# Patient Record
Sex: Female | Born: 1970 | Race: Black or African American | Hispanic: No | Marital: Single | Smoking: Never smoker
Health system: Southern US, Community
[De-identification: ages and names within clinical notes are randomized; demographics above are authoritative.]

## PROBLEM LIST (undated history)

## (undated) DIAGNOSIS — Z789 Other specified health status: Secondary | ICD-10-CM

## (undated) DIAGNOSIS — D219 Benign neoplasm of connective and other soft tissue, unspecified: Secondary | ICD-10-CM

## (undated) HISTORY — PX: FRACTURE SURGERY: SHX138

---

## 1999-07-09 ENCOUNTER — Other Ambulatory Visit: Admission: RE | Admit: 1999-07-09 | Discharge: 1999-07-09 | Payer: Self-pay | Admitting: Obstetrics & Gynecology

## 1999-11-15 ENCOUNTER — Other Ambulatory Visit: Admission: RE | Admit: 1999-11-15 | Discharge: 1999-11-15 | Payer: Self-pay | Admitting: Obstetrics & Gynecology

## 1999-12-25 ENCOUNTER — Other Ambulatory Visit: Admission: RE | Admit: 1999-12-25 | Discharge: 1999-12-25 | Payer: Self-pay | Admitting: *Deleted

## 2010-07-22 ENCOUNTER — Other Ambulatory Visit: Admission: RE | Admit: 2010-07-22 | Discharge: 2010-07-22 | Payer: Self-pay | Admitting: Family Medicine

## 2012-10-27 ENCOUNTER — Emergency Department (HOSPITAL_COMMUNITY): Payer: No Typology Code available for payment source

## 2012-10-27 ENCOUNTER — Emergency Department (HOSPITAL_COMMUNITY)
Admission: EM | Admit: 2012-10-27 | Discharge: 2012-10-27 | Disposition: A | Discharge status: Home / Self Care | Payer: No Typology Code available for payment source | Attending: Emergency Medicine | Admitting: Emergency Medicine

## 2012-10-27 ENCOUNTER — Encounter (HOSPITAL_COMMUNITY): Payer: Self-pay | Admitting: Emergency Medicine

## 2012-10-27 DIAGNOSIS — S4980XA Other specified injuries of shoulder and upper arm, unspecified arm, initial encounter: Secondary | ICD-10-CM | POA: Insufficient documentation

## 2012-10-27 DIAGNOSIS — R42 Dizziness and giddiness: Secondary | ICD-10-CM | POA: Insufficient documentation

## 2012-10-27 DIAGNOSIS — Y9389 Activity, other specified: Secondary | ICD-10-CM | POA: Insufficient documentation

## 2012-10-27 DIAGNOSIS — S46909A Unspecified injury of unspecified muscle, fascia and tendon at shoulder and upper arm level, unspecified arm, initial encounter: Secondary | ICD-10-CM | POA: Insufficient documentation

## 2012-10-27 DIAGNOSIS — Y92410 Unspecified street and highway as the place of occurrence of the external cause: Secondary | ICD-10-CM | POA: Insufficient documentation

## 2012-10-27 DIAGNOSIS — S139XXA Sprain of joints and ligaments of unspecified parts of neck, initial encounter: Secondary | ICD-10-CM

## 2012-10-27 MED ORDER — IBUPROFEN 600 MG PO TABS: 600.0000 mg | ORAL_TABLET | Freq: Four times a day (QID) | ORAL | Status: DC | PRN

## 2012-10-27 MED ORDER — HYDROCODONE-ACETAMINOPHEN 5-500 MG PO TABS: 1.0000 | ORAL_TABLET | Freq: Four times a day (QID) | ORAL | Status: DC | PRN
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
## 2012-10-29 NOTE — ED Provider Notes (Signed)
Medical screening examination/treatment/procedure(s) were performed by non-physician practitioner and as supervising physician I was immediately available for consultation/collaboration.  Kaitlynne Wenz R. Alejandra Hunt, MD 10/29/12 0655 

## 2012-10-29 NOTE — ED Provider Notes (Signed)
Medical screening examination/treatment/procedure(s) were performed by non-physician practitioner and as supervising physician I was immediately available for consultation/collaboration.  Ahnaf Caponi R. Adja Ruff, MD 10/29/12 0655 

## 2015-01-03 ENCOUNTER — Other Ambulatory Visit: Payer: Self-pay | Admitting: Family Medicine

## 2015-01-03 ENCOUNTER — Other Ambulatory Visit (HOSPITAL_COMMUNITY)
Admission: RE | Admit: 2015-01-03 | Discharge: 2015-01-03 | Disposition: A | Discharge status: Home / Self Care | Payer: 59 | Source: Ambulatory Visit | Attending: Family Medicine | Admitting: Family Medicine

## 2015-01-03 DIAGNOSIS — Z01419 Encounter for gynecological examination (general) (routine) without abnormal findings: Secondary | ICD-10-CM | POA: Diagnosis present

## 2015-01-09 LAB — CYTOLOGY - PAP

## 2015-02-22 ENCOUNTER — Other Ambulatory Visit: Payer: Self-pay | Admitting: Obstetrics and Gynecology

## 2015-02-22 DIAGNOSIS — D250 Submucous leiomyoma of uterus: Secondary | ICD-10-CM

## 2015-02-23 ENCOUNTER — Other Ambulatory Visit: Payer: Self-pay | Admitting: Obstetrics and Gynecology

## 2015-02-23 DIAGNOSIS — D250 Submucous leiomyoma of uterus: Secondary | ICD-10-CM

## 2015-05-08 ENCOUNTER — Ambulatory Visit
Admission: RE | Admit: 2015-05-08 | Discharge: 2015-05-08 | Disposition: A | Discharge status: Home / Self Care | Payer: 59 | Source: Ambulatory Visit | Attending: Obstetrics and Gynecology | Admitting: Obstetrics and Gynecology

## 2015-05-08 DIAGNOSIS — D250 Submucous leiomyoma of uterus: Secondary | ICD-10-CM | POA: Insufficient documentation

## 2015-05-08 HISTORY — DX: Benign neoplasm of connective and other soft tissue, unspecified: D21.9

## 2015-05-08 NOTE — Consult Note (Signed)
Chief Complaint:   I am here to discuss fibroid embolization.     Referring Physician(s): Haygood,Vanessa P  History of Present Illness: Barbara Randolph is a 44 y.o. female kindly referred by Dr. Leo Grosser for evaluation for potential uterine artery embolization for symptomatic uterine fibroids.  [name] presents today seeking information about uterine artery embolization as a treatment for her symptomatic uterine fibroids. She reports that her uterine fibroids have recently been diagnosed by ultrasound evaluation which was performed 01/10/2015.   She reports symptoms within all 3 categories of symptoms attributable to uterine fibroids, including heavy bleeding, abdominal pain, and bulk-type symptoms. She reports that her cycles last 5 days to 7 days, and she is required to change her pads 2-3 times per day. She denies any inter-menstrual bleeding with rare spotting. Her cycles have been regular for as long as she can remember. She does report some low abdominal pain and pelvic pressure, with frequent urination and urgency. She also reports having to wake at night to empty her bladder.  Importantly, she has not yet had children, and expresses a wish to become pregnant in the future.  At this point, she has not been treated medically for her symptomatic fibroids. I performed a complete informed consent regarding natural history, pathology/pathophysiology and treatment options for uterine fibroids. Treatment options which I discussed included the surgical option of hysterectomy, medical treatment with hormone therapy, high intensity focused ultrasound, and interventional radiology with bilateral uterine artery embolization. I briefly discussed interventional radiology technique and expectations for the procedure as well as expectations for symptom relief. I quoted [name] the expected success rate of greater than 90% for her symptoms of bulk symptoms, pain relief, and bleeding. This is  consistent with the available literature. I also shared with her a greater than 90% success at 12 months. I shared with her the published information regarding satisfaction of patient's undergoing [place], which is overwhelmingly positive.  Importantly, I had a long discussion regarding her desire for future pregnancy. Specifically, I discussed the guidelines of the Society of Interventional Radiology which caution first-line treatment of symptomatic uterine fibroids with uterine artery embolization in a woman who desires a future pregnancy. I shared with her our current information that can support this as first-line therapy, given that treatment with [place] may preclude successful pregnancy.  I explicitly discussed with her options for treatment, which do include myomectomy as well as high intensity focused ultrasound. I was explicit that HIFU is at least an inconvenient option given the lack of treatment facilities in the region, if not impossible for her.     Past Medical History  Diagnosis Date  . Fibroids     No past surgical history on file.  Allergies: Review of patient's allergies indicates no known allergies.  Medications: Prior to Admission medications   Medication Sig Start Date End Date Taking? Authorizing Provider  cholecalciferol (VITAMIN D) 1000 UNITS tablet Take 1,000 Units by mouth daily.   Yes Historical Provider, MD  ibuprofen (ADVIL,MOTRIN) 600 MG tablet Take 1 tablet (600 mg total) by mouth every 6 (six) hours as needed for pain. 10/27/12  Yes Tatyana Kirichenko, PA-C  Multiple Vitamin (MULTIVITAMIN WITH MINERALS) TABS Take 1 tablet by mouth daily.   Yes Historical Provider, MD  OVER THE COUNTER MEDICATION 4 capsules. Fiber pills from whole foods   Yes Historical Provider, MD  tretinoin microspheres (RETIN-A MICRO) 0.1 % gel Apply 1 application topically at bedtime. On face   Yes Historical Provider, MD  HYDROcodone-acetaminophen (VICODIN) 5-500 MG per tablet Take 1-2 tablets  by mouth every 6 (six) hours as needed for pain. Patient not taking: Reported on 05/08/2015 10/27/12   Jeannett Senior, PA-C     No family history on file.  Social History   Social History  . Marital Status: Single    Spouse Name: N/A  . Number of Children: N/A  . Years of Education: N/A   Social History Main Topics  . Smoking status: Never Smoker   . Smokeless tobacco: Never Used  . Alcohol Use: No  . Drug Use: No  . Sexual Activity: Not on file   Other Topics Concern  . Not on file   Social History Narrative     Review of Systems: A 12 point ROS discussed and pertinent positives are indicated in the HPI above.  All other systems are negative.  Review of Systems  Vital Signs: BP 147/79 mmHg  Pulse 56  Temp(Src) 97.9 F (36.6 C) (Oral)  Resp 14  Ht 5\' 2"  (1.575 m)  Wt 166 lb (75.297 kg)  BMI 30.35 kg/m2  SpO2 100%  LMP 05/01/2015  Physical Exam   Atraumatic normocephalic mucous membranes moist and pink. No icterus or scleral injection. Conjugate gaze. Alert and oriented to person place and time. Normal affect. Good insight. Appropriate questions. Moving all 4 extremities equally. Gross motor and gross sensory intact. Inspiration and expiration symmetric with no labored breathing. Normal excursion of the chest. Abdomen without muscle guarding or rigidity. Genitourinary deferred. No swelling of the lower extremities.  Mallampati Score:  2  Imaging: No results found.  Labs:  CBC: No results for input(s): WBC, HGB, HCT, PLT in the last 8760 hours.  COAGS: No results for input(s): INR, APTT in the last 8760 hours.  BMP: No results for input(s): NA, K, CL, CO2, GLUCOSE, BUN, CALCIUM, CREATININE, GFRNONAA, GFRAA in the last 8760 hours.  Invalid input(s): CMP  LIVER FUNCTION TESTS: No results for input(s): BILITOT, AST, ALT, ALKPHOS, PROT, ALBUMIN in the last 8760 hours.  TUMOR MARKERS: No results for input(s): AFPTM, CEA, CA199, CHROMGRNA in  the last 8760 hours.  Assessment and Plan:  [name] is a 44 year old female with symptomatic uterine fibroids, with symptoms in all 3 categories of a triple symptoms.  She is a woman who desires a future pregnancy, and I had a long discussion with her regarding uterine fibroid embolization as first-line therapy given her pregnancy desire (see above discussion in the history of present illness).  At this time, given her clear hesitancy to proceed with uterine artery embolization with the possibility of difficulty with a future pregnancy, she will pursue other information regarding other options. I specifically gave her information about HIFU and myomectomy, which she seems motivated to investigate.  She understands that she may call us at any time if she wishes to proceed, as she is a likely candidate for [place]. If she wishes to proceed, I would order MRI of the pelvis for evaluation.  I answered all of her questions to the best of my ability. She seems satisfied with our discussion, and agrees with the plan of care.  Thank you for this interesting consult.  I greatly enjoyed meeting [facility] and look forward to participating in their care.  A copy of this report was sent to the requesting provider on this date.  SignedCorrie Mckusick 05/08/2015, 5:00 PM   I spent a total of  40 Minutes   in face to face in  clinical consultation, greater than 50% of which was counseling/coordinating care for symptomatic uterine fibroids, and the possibility of uterine artery embolization.

## 2016-01-28 ENCOUNTER — Other Ambulatory Visit: Payer: Self-pay | Admitting: Obstetrics and Gynecology

## 2016-01-30 ENCOUNTER — Encounter (HOSPITAL_COMMUNITY): Payer: Self-pay

## 2016-01-30 ENCOUNTER — Encounter (HOSPITAL_COMMUNITY)
Admission: RE | Admit: 2016-01-30 | Discharge: 2016-01-30 | Disposition: A | Discharge status: Home / Self Care | Payer: 59 | Source: Ambulatory Visit | Attending: Obstetrics and Gynecology | Admitting: Obstetrics and Gynecology

## 2016-01-30 DIAGNOSIS — Z01812 Encounter for preprocedural laboratory examination: Secondary | ICD-10-CM | POA: Diagnosis not present

## 2016-01-30 DIAGNOSIS — D259 Leiomyoma of uterus, unspecified: Secondary | ICD-10-CM | POA: Insufficient documentation

## 2016-01-30 DIAGNOSIS — D649 Anemia, unspecified: Secondary | ICD-10-CM | POA: Diagnosis not present

## 2016-01-30 HISTORY — DX: Other specified health status: Z78.9

## 2016-01-30 LAB — ABO/RH: ABO/RH(D): A POS

## 2016-01-30 LAB — CBC
HEMATOCRIT: 35.5 % — AB (ref 36.0–46.0)
HEMOGLOBIN: 10.7 g/dL — AB (ref 12.0–15.0)
MCH: 23.4 pg — ABNORMAL LOW (ref 26.0–34.0)
MCHC: 30.1 g/dL (ref 30.0–36.0)
MCV: 77.7 fL — AB (ref 78.0–100.0)
Platelets: 330 10*3/uL (ref 150–400)
RBC: 4.57 MIL/uL (ref 3.87–5.11)
RDW: 16.5 % — ABNORMAL HIGH (ref 11.5–15.5)
WBC: 5.1 10*3/uL (ref 4.0–10.5)

## 2016-01-30 LAB — TYPE AND SCREEN
ABO/RH(D): A POS
ANTIBODY SCREEN: NEGATIVE

## 2016-01-30 NOTE — Patient Instructions (Signed)
Your procedure is scheduled on:02/04/15  Enter through the Main Entrance at :6am Pick up desk phone and dial [phone] and inform us of your arrival.  Please call [phone] if you have any problems the morning of surgery.  Remember: Do not eat food or drink liquids, including water, after midnight:Sunday    You may brush your teeth the morning of surgery.  Take these meds the morning of surgery with a sip of water:none  DO NOT wear jewelry, eye make-up, lipstick,body lotion, or dark fingernail polish.  (Polished toes are ok) You may wear deodorant.  If you are to be admitted after surgery, leave suitcase in car until your room has been assigned.  Wear loose fitting, comfortable clothes for your ride home.

## 2016-02-01 ENCOUNTER — Other Ambulatory Visit (HOSPITAL_COMMUNITY): Payer: Self-pay | Admitting: Obstetrics and Gynecology

## 2016-02-01 NOTE — H&P (Signed)
Barbara Randolph is a 45 y.o. female  G:0 presents for an abdominal myomectomy because of large  symptomatic uterine fibroids.  The patient was referred by her primary care physician, Dr. Donald Prose for evaluation and management of her fibroids.  She was diagnosed in 2016 with fibroids and since that time has noticed increasing urinary frequency, nocturia and constipation.  She denies any pelvic pain, urinary retention, post void dribbling,  or diarrhea.  Her menstrual flow lasts for 5 days and she changes her protection 3 times a day.  Though she experiences menstrual cramping rated at 5/10 on a 10 point pain scale she gets complete relief with Motrin 400 mg.   After a review of both medical and surgical management options for fibroids the patient has decided to undergo myomectomy for management.   Past Medical History  OB History: G: 0  GYN History: menarche: 45 YO    LMP: 01/23/16    Contracepton abstinence  The patient denies history of sexually transmitted disease.  Has a remote history of abnormal PAP smear  but after if was repeated it returned normal.  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 ferrous sulfate 325 (65 FE) MG tablet Take 325 mg by mouth 2 (two) times daily with a meal.  . Multiple Vitamin (MULTIVITAMIN WITH MINERALS) TABS Take 1 tablet by mouth daily.  Marland Kitchen OVER THE COUNTER MEDICATION Take 4 capsules by mouth daily. Fiber pills from whole foods  . tretinoin microspheres (RETIN-A MICRO) 0.1 % gel Apply 1 application topically at bedtime. On face   No facility-administered encounter medications on file as of 02/01/2016.   Tazorac  0.05% Topical Gel  as directed Clindamycin 1%/Benzoyl Peroxide 5% Topical Gel as directed   No Known Allergies    Denies sensitivity to peanuts, shellfish, soy, latex or adhesives.   ROS: Admits to glasses,  acne,  constipation, urinary frequency ,  nocturia and left ankle pain;   Denies headache, vision changes, nasal congestion, dysphagia, tinnitus, dizziness, hoarseness, cough,  chest pain, shortness of breath, nausea, vomiting, diarrhea,  urgency  dysuria, hematuria, vaginitis symptoms, pelvic pain, swelling of joints,easy bruising,  myalgias,  skin rashes, unexplained weight loss and except as is mentioned in the history of present illness, patient's review of systems is otherwise negative.   Physical Exam  Bp: 122/68   P: 68   R: 18  Temperature: 98.8 degrees F orally   Weight: 192 lbs.  Height: 5' 2.5 "  BMI: 34.6  Neck: supple without masses or thyromegaly Lungs: clear to auscultation Heart: regular rate and rhythm Abdomen: firm mass from pelvis to approximately  fingers below the umbilicus though exam limited by habitus and no organomegaly Pelvic:EGBUS- wnl; vagina-normal rugae; uterus-20 weeks  size cervix without lesions or motion tenderness; adnexae-no tenderness or masses Extremities:  no clubbing, cyanosis or edema   Assesment: Symptomatic Uterine Fibroids   Disposition:  The patient was given the indication for her procedure along with its risks to include, but not limited to:  reaction to anesthesia, damage to adjacent organs, infection, excessive bleeding and if the uterine cavity is breached, a C-section delivery would be  necessary. The patient verbalized understanding of these risks and has consented to proceed with an Abdominal Myomectomy at [facility] on Feb 04, 2016.   CSN# [id number]   Whittaker Lenis J. Florene Glen, PA-C  for Dr. Seymour Bars. Haygood

## 2016-02-03 MED ORDER — DEXTROSE 5 % IV SOLN
2.0000 g | INTRAVENOUS | Status: AC
  Administered 2016-02-04: 2 g via INTRAVENOUS
  Filled 2016-02-03: qty 2

## 2016-02-04 ENCOUNTER — Inpatient Hospital Stay (HOSPITAL_COMMUNITY)
Admission: RE | Admit: 2016-02-04 | Discharge: 2016-02-05 | DRG: 743 | Disposition: A | Discharge status: Home / Self Care | Payer: 59 | Source: Ambulatory Visit | Attending: Obstetrics and Gynecology | Admitting: Obstetrics and Gynecology

## 2016-02-04 ENCOUNTER — Inpatient Hospital Stay (HOSPITAL_COMMUNITY): Payer: 59 | Admitting: Certified Registered Nurse Anesthetist

## 2016-02-04 ENCOUNTER — Encounter (HOSPITAL_COMMUNITY): Payer: Self-pay

## 2016-02-04 ENCOUNTER — Encounter (HOSPITAL_COMMUNITY)
Admission: RE | Disposition: A | Discharge status: Home / Self Care | Payer: Self-pay | Source: Ambulatory Visit | Attending: Obstetrics and Gynecology

## 2016-02-04 DIAGNOSIS — D500 Iron deficiency anemia secondary to blood loss (chronic): Secondary | ICD-10-CM | POA: Diagnosis present

## 2016-02-04 DIAGNOSIS — D259 Leiomyoma of uterus, unspecified: Secondary | ICD-10-CM | POA: Diagnosis present

## 2016-02-04 DIAGNOSIS — D250 Submucous leiomyoma of uterus: Secondary | ICD-10-CM | POA: Diagnosis present

## 2016-02-04 HISTORY — PX: MYOMECTOMY: SHX85

## 2016-02-04 LAB — PREGNANCY, URINE: PREG TEST UR: NEGATIVE

## 2016-02-04 SURGERY — MYOMECTOMY, ABDOMINAL APPROACH
Anesthesia: Epidural | Site: Abdomen

## 2016-02-04 MED ORDER — VASOPRESSIN 20 UNIT/ML IJ SOLN
INTRAMUSCULAR | Status: DC | PRN
  Administered 2016-02-04: 40 [IU] via INTRAMUSCULAR

## 2016-02-04 MED ORDER — NEOSTIGMINE METHYLSULFATE 10 MG/10ML IV SOLN
INTRAVENOUS | Status: AC
  Filled 2016-02-04: qty 1

## 2016-02-04 MED ORDER — PROPOFOL 10 MG/ML IV BOLUS
INTRAVENOUS | Status: AC
  Filled 2016-02-04: qty 20

## 2016-02-04 MED ORDER — KETOROLAC TROMETHAMINE 30 MG/ML IJ SOLN: 30.0000 mg | Freq: Once | INTRAMUSCULAR | Status: DC

## 2016-02-04 MED ORDER — MIDAZOLAM HCL 2 MG/2ML IJ SOLN
INTRAMUSCULAR | Status: AC
  Filled 2016-02-04: qty 2

## 2016-02-04 MED ORDER — IBUPROFEN 600 MG PO TABS
600.0000 mg | ORAL_TABLET | Freq: Four times a day (QID) | ORAL | Status: DC
  Administered 2016-02-05 (×2): 600 mg via ORAL
  Filled 2016-02-04 (×2): qty 1

## 2016-02-04 MED ORDER — MEPERIDINE HCL 25 MG/ML IJ SOLN: 6.2500 mg | INTRAMUSCULAR | Status: DC | PRN

## 2016-02-04 MED ORDER — BUPIVACAINE HCL (PF) 0.25 % IJ SOLN
INTRAMUSCULAR | Status: DC | PRN
  Administered 2016-02-04: 20 mL

## 2016-02-04 MED ORDER — ACETAMINOPHEN 10 MG/ML IV SOLN
1000.0000 mg | Freq: Once | INTRAVENOUS | Status: AC
  Administered 2016-02-04: 1000 mg via INTRAVENOUS
  Filled 2016-02-04: qty 100

## 2016-02-04 MED ORDER — SODIUM CHLORIDE 0.9 % IJ SOLN
INTRAMUSCULAR | Status: DC | PRN
  Administered 2016-02-04: 100 mL

## 2016-02-04 MED ORDER — ONDANSETRON HCL 4 MG/2ML IJ SOLN
4.0000 mg | Freq: Once | INTRAMUSCULAR | Status: DC | PRN
Start: 2016-02-04 — End: 2016-02-04

## 2016-02-04 MED ORDER — SCOPOLAMINE 1 MG/3DAYS TD PT72
MEDICATED_PATCH | TRANSDERMAL | Status: AC
  Administered 2016-02-04: 1.5 mg via TRANSDERMAL
  Filled 2016-02-04: qty 1

## 2016-02-04 MED ORDER — PROPOFOL 10 MG/ML IV BOLUS
INTRAVENOUS | Status: DC | PRN
  Administered 2016-02-04: 50 mg via INTRAVENOUS
  Administered 2016-02-04: 200 mg via INTRAVENOUS
  Administered 2016-02-04: 30 mg via INTRAVENOUS

## 2016-02-04 MED ORDER — VASOPRESSIN 20 UNIT/ML IV SOLN
INTRAVENOUS | Status: AC
  Filled 2016-02-04: qty 2

## 2016-02-04 MED ORDER — LACTATED RINGERS IV SOLN
INTRAVENOUS | Status: DC
  Administered 2016-02-04: 14:00:00 via INTRAVENOUS

## 2016-02-04 MED ORDER — ROCURONIUM BROMIDE 100 MG/10ML IV SOLN
INTRAVENOUS | Status: DC | PRN
  Administered 2016-02-04: 5 mg via INTRAVENOUS
  Administered 2016-02-04: 10 mg via INTRAVENOUS
  Administered 2016-02-04: 45 mg via INTRAVENOUS
  Administered 2016-02-04: 20 mg via INTRAVENOUS
  Administered 2016-02-04: 10 mg via INTRAVENOUS

## 2016-02-04 MED ORDER — MIDAZOLAM HCL 2 MG/2ML IJ SOLN
INTRAMUSCULAR | Status: DC | PRN
  Administered 2016-02-04: 2 mg via INTRAVENOUS

## 2016-02-04 MED ORDER — HYDROMORPHONE HCL 1 MG/ML IJ SOLN
INTRAMUSCULAR | Status: DC | PRN
  Administered 2016-02-04 (×2): 1 mg via INTRAVENOUS

## 2016-02-04 MED ORDER — NEOSTIGMINE METHYLSULFATE 10 MG/10ML IV SOLN
INTRAVENOUS | Status: DC | PRN
  Administered 2016-02-04: 3 mg via INTRAVENOUS

## 2016-02-04 MED ORDER — PHENYLEPHRINE 40 MCG/ML (10ML) SYRINGE FOR IV PUSH (FOR BLOOD PRESSURE SUPPORT)
PREFILLED_SYRINGE | INTRAVENOUS | Status: AC
  Filled 2016-02-04: qty 10

## 2016-02-04 MED ORDER — FENTANYL CITRATE (PF) 100 MCG/2ML IJ SOLN
INTRAMUSCULAR | Status: DC | PRN
  Administered 2016-02-04 (×2): 50 ug via INTRAVENOUS
  Administered 2016-02-04: 100 ug via INTRAVENOUS
  Administered 2016-02-04 (×4): 50 ug via INTRAVENOUS

## 2016-02-04 MED ORDER — LIDOCAINE HCL (CARDIAC) 20 MG/ML IV SOLN
INTRAVENOUS | Status: DC | PRN
  Administered 2016-02-04: 80 mg via INTRAVENOUS

## 2016-02-04 MED ORDER — KETOROLAC TROMETHAMINE 30 MG/ML IJ SOLN
INTRAMUSCULAR | Status: AC
  Filled 2016-02-04: qty 1

## 2016-02-04 MED ORDER — SCOPOLAMINE 1 MG/3DAYS TD PT72
1.0000 | MEDICATED_PATCH | Freq: Once | TRANSDERMAL | Status: DC
  Administered 2016-02-04: 1.5 mg via TRANSDERMAL

## 2016-02-04 MED ORDER — BUPIVACAINE HCL (PF) 0.25 % IJ SOLN
INTRAMUSCULAR | Status: AC
  Filled 2016-02-04: qty 30

## 2016-02-04 MED ORDER — HEPARIN SODIUM (PORCINE) 5000 UNIT/ML IJ SOLN
INTRAMUSCULAR | Status: AC
  Filled 2016-02-04: qty 1

## 2016-02-04 MED ORDER — KETOROLAC TROMETHAMINE 30 MG/ML IJ SOLN
INTRAMUSCULAR | Status: DC | PRN
  Administered 2016-02-04: 30 mg via INTRAVENOUS

## 2016-02-04 MED ORDER — DEXAMETHASONE SODIUM PHOSPHATE 4 MG/ML IJ SOLN
INTRAMUSCULAR | Status: AC
  Filled 2016-02-04: qty 1

## 2016-02-04 MED ORDER — GLYCOPYRROLATE 0.2 MG/ML IJ SOLN
INTRAMUSCULAR | Status: DC | PRN
Start: 2016-02-04 — End: 2016-02-04
  Administered 2016-02-04: .4 mg via INTRAVENOUS

## 2016-02-04 MED ORDER — ONDANSETRON HCL 4 MG/2ML IJ SOLN
4.0000 mg | Freq: Four times a day (QID) | INTRAMUSCULAR | Status: DC | PRN
  Administered 2016-02-04: 4 mg via INTRAVENOUS
  Filled 2016-02-04: qty 2

## 2016-02-04 MED ORDER — ONDANSETRON HCL 4 MG/2ML IJ SOLN
INTRAMUSCULAR | Status: AC
  Filled 2016-02-04: qty 2

## 2016-02-04 MED ORDER — DEXAMETHASONE SODIUM PHOSPHATE 4 MG/ML IJ SOLN
INTRAMUSCULAR | Status: DC | PRN
  Administered 2016-02-04: 4 mg via INTRAVENOUS

## 2016-02-04 MED ORDER — FENTANYL CITRATE (PF) 250 MCG/5ML IJ SOLN
INTRAMUSCULAR | Status: AC
  Filled 2016-02-04: qty 5

## 2016-02-04 MED ORDER — SENNOSIDES-DOCUSATE SODIUM 8.6-50 MG PO TABS: 1.0000 | ORAL_TABLET | Freq: Every evening | ORAL | Status: DC | PRN

## 2016-02-04 MED ORDER — PROPOFOL 10 MG/ML IV BOLUS
INTRAVENOUS | Status: AC
  Filled 2016-02-04: qty 40

## 2016-02-04 MED ORDER — PANTOPRAZOLE SODIUM 40 MG PO TBEC
40.0000 mg | DELAYED_RELEASE_TABLET | Freq: Every day | ORAL | Status: DC
  Administered 2016-02-05: 40 mg via ORAL
  Filled 2016-02-04: qty 1

## 2016-02-04 MED ORDER — HEPARIN SODIUM (PORCINE) 5000 UNIT/ML IJ SOLN
INTRAMUSCULAR | Status: DC | PRN
  Administered 2016-02-04: 5000 [IU]

## 2016-02-04 MED ORDER — ALUM & MAG HYDROXIDE-SIMETH 200-200-20 MG/5ML PO SUSP: 30.0000 mL | ORAL | Status: DC | PRN

## 2016-02-04 MED ORDER — SIMETHICONE 80 MG PO CHEW: 80.0000 mg | CHEWABLE_TABLET | Freq: Four times a day (QID) | ORAL | Status: DC | PRN

## 2016-02-04 MED ORDER — KETOROLAC TROMETHAMINE 30 MG/ML IJ SOLN: 30.0000 mg | Freq: Four times a day (QID) | INTRAMUSCULAR | Status: AC

## 2016-02-04 MED ORDER — ONDANSETRON HCL 4 MG PO TABS: 4.0000 mg | ORAL_TABLET | Freq: Four times a day (QID) | ORAL | Status: DC | PRN

## 2016-02-04 MED ORDER — ONDANSETRON HCL 4 MG/2ML IJ SOLN
INTRAMUSCULAR | Status: DC | PRN
  Administered 2016-02-04: 4 mg via INTRAVENOUS

## 2016-02-04 MED ORDER — HYDROMORPHONE HCL 1 MG/ML IJ SOLN
0.2000 mg | INTRAMUSCULAR | Status: DC | PRN
  Administered 2016-02-04: 0.6 mg via INTRAVENOUS
  Filled 2016-02-04: qty 1

## 2016-02-04 MED ORDER — LIDOCAINE HCL (CARDIAC) 20 MG/ML IV SOLN
INTRAVENOUS | Status: AC
  Filled 2016-02-04: qty 5

## 2016-02-04 MED ORDER — SUCCINYLCHOLINE CHLORIDE 20 MG/ML IJ SOLN
INTRAMUSCULAR | Status: AC
  Filled 2016-02-04: qty 1

## 2016-02-04 MED ORDER — HYDROMORPHONE HCL 1 MG/ML IJ SOLN
INTRAMUSCULAR | Status: AC
  Filled 2016-02-04: qty 1

## 2016-02-04 MED ORDER — SUCCINYLCHOLINE CHLORIDE 20 MG/ML IJ SOLN
INTRAMUSCULAR | Status: DC | PRN
  Administered 2016-02-04: 120 mg via INTRAVENOUS

## 2016-02-04 MED ORDER — HYDROMORPHONE HCL 1 MG/ML IJ SOLN: 0.2500 mg | INTRAMUSCULAR | Status: DC | PRN

## 2016-02-04 MED ORDER — SODIUM CHLORIDE 0.9 % IJ SOLN
INTRAMUSCULAR | Status: AC
  Filled 2016-02-04: qty 100

## 2016-02-04 MED ORDER — GLYCOPYRROLATE 0.2 MG/ML IJ SOLN
INTRAMUSCULAR | Status: AC
  Filled 2016-02-04: qty 2

## 2016-02-04 MED ORDER — LACTATED RINGERS IV SOLN
INTRAVENOUS | Status: DC
  Administered 2016-02-04 (×3): via INTRAVENOUS

## 2016-02-04 MED ORDER — KETOROLAC TROMETHAMINE 30 MG/ML IJ SOLN
30.0000 mg | Freq: Four times a day (QID) | INTRAMUSCULAR | Status: AC
  Administered 2016-02-04 (×2): 30 mg via INTRAVENOUS
  Filled 2016-02-04 (×3): qty 1

## 2016-02-04 MED ORDER — HYDROCODONE-ACETAMINOPHEN 5-325 MG PO TABS
1.0000 | ORAL_TABLET | ORAL | Status: DC | PRN
  Administered 2016-02-05 (×2): 1 via ORAL
  Filled 2016-02-04 (×2): qty 1

## 2016-02-04 MED ORDER — ROCURONIUM BROMIDE 100 MG/10ML IV SOLN
INTRAVENOUS | Status: AC
  Filled 2016-02-04: qty 1

## 2016-02-04 SURGICAL SUPPLY — 49 items
BARRIER ADHS 3X4 INTERCEED (GAUZE/BANDAGES/DRESSINGS) ×4 IMPLANT
BLADE EXTENDED COATED 6.5IN (ELECTRODE) IMPLANT
BRR ADH 4X3 ABS CNTRL BYND (GAUZE/BANDAGES/DRESSINGS) ×2
CANISTER SUCT 3000ML (MISCELLANEOUS) ×3 IMPLANT
CLOTH BEACON ORANGE TIMEOUT ST (SAFETY) ×3 IMPLANT
CONT PATH 16OZ SNAP LID 3702 (MISCELLANEOUS) ×1 IMPLANT
DECANTER SPIKE VIAL GLASS SM (MISCELLANEOUS) ×5 IMPLANT
DRAPE CESAREAN BIRTH W POUCH (DRAPES) ×3 IMPLANT
DRAPE SURG 17X11 SM STRL (DRAPES) ×6 IMPLANT
DRSG OPSITE POSTOP 4X10 (GAUZE/BANDAGES/DRESSINGS) ×2 IMPLANT
DURAPREP 26ML APPLICATOR (WOUND CARE) ×3 IMPLANT
ELECT CAUTERY BLADE 6.4 (BLADE) ×2 IMPLANT
ELECT NDL TIP 2.8 STRL (NEEDLE) IMPLANT
ELECT NEEDLE TIP 2.8 STRL (NEEDLE) IMPLANT
GAUZE SPONGE 4X4 16PLY XRAY LF (GAUZE/BANDAGES/DRESSINGS) ×4 IMPLANT
GLOVE BIOGEL PI IND STRL 7.0 (GLOVE) ×1 IMPLANT
GLOVE BIOGEL PI INDICATOR 7.0 (GLOVE) ×2
GLOVE SURG SS PI 6.5 STRL IVOR (GLOVE) ×6 IMPLANT
GOWN STRL REUS W/TWL LRG LVL3 (GOWN DISPOSABLE) ×9 IMPLANT
LIQUID BAND (GAUZE/BANDAGES/DRESSINGS) ×2 IMPLANT
NDL SPNL 22GX3.5 QUINCKE BK (NEEDLE) ×1 IMPLANT
NEEDLE HYPO 22GX1.5 SAFETY (NEEDLE) ×5 IMPLANT
NEEDLE SPNL 22GX3.5 QUINCKE BK (NEEDLE) ×3 IMPLANT
NS IRRIG 1000ML POUR BTL (IV SOLUTION) ×4 IMPLANT
PACK ABDOMINAL GYN (CUSTOM PROCEDURE TRAY) ×3 IMPLANT
PAD OB MATERNITY 4.3X12.25 (PERSONAL CARE ITEMS) ×3 IMPLANT
PENCIL SMOKE EVAC W/HOLSTER (ELECTROSURGICAL) ×3 IMPLANT
RINGERS IRRIG 1000ML POUR BTL (IV SOLUTION) ×5 IMPLANT
SPONGE LAP 18X18 X RAY DECT (DISPOSABLE) ×6 IMPLANT
STAPLER VISISTAT 35W (STAPLE) IMPLANT
SUT MNCRL AB 3-0 PS2 27 (SUTURE) IMPLANT
SUT PDS AB 0 CT 36 (SUTURE) IMPLANT
SUT VIC AB 0 CT1 18XCR BRD8 (SUTURE) IMPLANT
SUT VIC AB 0 CT1 27 (SUTURE) ×6
SUT VIC AB 0 CT1 27XBRD ANBCTR (SUTURE) ×2 IMPLANT
SUT VIC AB 0 CT1 8-18 (SUTURE) ×9
SUT VIC AB 2-0 CT1 27 (SUTURE) ×3
SUT VIC AB 2-0 CT1 TAPERPNT 27 (SUTURE) ×1 IMPLANT
SUT VIC AB 3-0 CT1 27 (SUTURE)
SUT VIC AB 3-0 CT1 TAPERPNT 27 (SUTURE) IMPLANT
SUT VIC AB 3-0 SH 27 (SUTURE) ×18
SUT VIC AB 3-0 SH 27X BRD (SUTURE) ×2 IMPLANT
SUT VICRYL 0 TIES 12 18 (SUTURE) IMPLANT
SUT VICRYL 2 0 18  UND BR (SUTURE)
SUT VICRYL 2 0 18 UND BR (SUTURE) IMPLANT
SYR CONTROL 10ML LL (SYRINGE) ×6 IMPLANT
TOWEL OR 17X24 6PK STRL BLUE (TOWEL DISPOSABLE) ×6 IMPLANT
TRAY FOLEY CATH SILVER 14FR (SET/KITS/TRAYS/PACK) ×3 IMPLANT
WATER STERILE IRR 1000ML POUR (IV SOLUTION) ×1 IMPLANT

## 2016-02-04 NOTE — H&P (Signed)
History and Physical Interval Note:   02/04/2016   7:15 AM   Barbara Randolph  has presented today for surgery, with the diagnosis of Uterine Fibroids  The various methods of treatment have been discussed with the patient and family. After consideration of risks, benefits and other options for treatment, the patient has consented to  Procedure(s): MYOMECTOMY as a surgical intervention .  I have reviewed the patients' chart and labs.  Questions were answered to the patient's satisfaction.     Eldred Manges  MD

## 2016-02-04 NOTE — Anesthesia Preprocedure Evaluation (Addendum)
Anesthesia Evaluation  Patient identified by MRN, date of birth, ID band Patient awake    Reviewed: Allergy & Precautions, H&P , NPO status , Patient's Chart, lab work & pertinent test results  Airway Mallampati: IV  TM Distance: >3 FB Neck ROM: full  Mouth opening: Limited Mouth Opening  Dental no notable dental hx.    Pulmonary neg pulmonary ROS,    Pulmonary exam normal        Cardiovascular negative cardio ROS Normal cardiovascular exam     Neuro/Psych negative neurological ROS  negative psych ROS   GI/Hepatic negative GI ROS, Neg liver ROS,   Endo/Other  negative endocrine ROS  Renal/GU negative Renal ROS     Musculoskeletal   Abdominal (+) + obese,   Peds  Hematology negative hematology ROS (+)   Anesthesia Other Findings   Reproductive/Obstetrics (+) Pregnancy                            Anesthesia Physical Anesthesia Plan  ASA: II  Anesthesia Plan: Epidural and General   Post-op Pain Management:    Induction: Intravenous  Airway Management Planned: Oral ETT  Additional Equipment:   Intra-op Plan:   Post-operative Plan: Extubation in OR  Informed Consent: I have reviewed the patients History and Physical, chart, labs and discussed the procedure including the risks, benefits and alternatives for the proposed anesthesia with the patient or authorized representative who has indicated his/her understanding and acceptance.   Dental advisory given  Plan Discussed with: CRNA and Surgeon  Anesthesia Plan Comments:        Anesthesia Quick Evaluation

## 2016-02-04 NOTE — Anesthesia Postprocedure Evaluation (Signed)
Anesthesia Post Note  Patient: [name]  Procedure(s) Performed: Procedure(s) (LRB): MYOMECTOMY (N/A)  Patient location during evaluation: [facility] Anesthesia Type: General Level of consciousness: awake, awake and alert and oriented Pain management: pain level controlled Vital Signs Assessment: post-procedure vital signs reviewed and stable Respiratory status: spontaneous breathing, nonlabored ventilation, respiratory function stable and patient connected to nasal cannula oxygen Cardiovascular status: stable Postop Assessment: no signs of nausea or vomiting and adequate PO intake Anesthetic complications: no     Last Vitals:  Filed Vitals:   02/04/16 1348 02/04/16 1647  BP: 115/67 113/65  Pulse: 64 59  Temp: 36.5 C 36.6 C  Resp: 16 16    Last Pain:  Filed Vitals:   02/04/16 1648  PainSc: Asleep   Pain Goal: Patients Stated Pain Goal: 4 (02/04/16 1238)               Willa Rough

## 2016-02-04 NOTE — Anesthesia Procedure Notes (Signed)
Procedure Name: Intubation Date/Time: 02/04/2016 7:30 AM Performed by: Elenore Paddy Pre-anesthesia Checklist: Patient identified, Emergency Drugs available, Suction available, Patient being monitored and Timeout performed Patient Re-evaluated:Patient Re-evaluated prior to inductionOxygen Delivery Method: Circle system utilized Preoxygenation: Pre-oxygenation with 100% oxygen Intubation Type: IV induction Ventilation: Mask ventilation without difficulty Laryngoscope Size: Glidescope and 3 (Glidescope used electively because of MP 4 airway exam.) Grade View: Grade I Tube type: Oral Tube size: 7.0 mm Number of attempts: 1

## 2016-02-04 NOTE — Transfer of Care (Signed)
Immediate Anesthesia Transfer of Care Note  Patient: Barbara Randolph  Procedure(s) Performed: Procedure(s): MYOMECTOMY (N/A)  Patient Location: PACU  Anesthesia Type:General  Level of Consciousness: awake, alert  and oriented  Airway & Oxygen Therapy: Patient Spontanous Breathing and Patient connected to nasal cannula oxygen  Post-op Assessment: Report given to RN and Post -op Vital signs reviewed and stable  Post vital signs: Reviewed and stable  Last Vitals:  Filed Vitals:   02/04/16 0611  BP: 135/85  Pulse: 66  Temp: 36.7 C  Resp: 16    Last Pain: There were no vitals filed for this visit.       Complications: No apparent anesthesia complications

## 2016-02-04 NOTE — Op Note (Signed)
Procedure(s): MYOMECTOMY Procedure Note  Barbara Randolph female 45 y.o. 02/04/2016  Procedure(s) and Anesthesia Type:    * MYOMECTOMY - General  Surgeon(s) and Role:    * Eldred Manges, MD - Primary    * Ena Dawley, MD - Assisting   Indications: The patient was admitted for management of symptomatic uterine fibroids     Surgeon: Kendall Flack P   Assistants: Gildardo Cranker M.D.  Anesthesia: General endotracheal anesthesia  ASA Class: 2    Procedure Detail  MYOMECTOMY  Findings:  The uterus was enlarged to approximately 24 weeks size with multiple submucosal myomata. These were situated so serosa only in the posterior fundus, the right and left cornual regions, and the anterior fundus. The tubes and ovaries were normal bilaterally.  Estimated Blood Loss:  500 cc  Blood Given: none          Specimens: Fibroids weighing 1151 g in aggregate. They ranged in size from 2 fibroids which were less than 2 cm to 4 fibroids which were 3 x 5 cm, 7 x 7 cm, 8 x 6 cm, and 15 x 10 cm, respectively.               Complications:  None         Disposition: PACU - hemodynamically stable.         Condition: stable  Procedure: The patient was taken to the operating room after appropriate identification placed on the operating table. After the attainment of adequate general anesthesia with the patient in the supine position. The perineum and vagina were prepped with multiple layers of Betadine. A Foley catheter was inserted into the bladder and connected to straight drainage.   A timeout was performed and the abdomen prepped with ChloraPrep and allowed to dry. The abdomen was then draped as a sterile field. Suprapubic injection of quarter percent Marcaine for a total of 20 cc was undertaken.   A suprapubic transverse incision was then made and the abdomen opened in layers. Peritoneum was entered and examination of the pelvis and abdomen undertaken. The enlarged uterus.  Fibroids were then delivered through the suprapubic vision onto the operative field. The uterus was surrounded with moist laparotomy pads a dilute solution of Pitressin was injected subserosally over the anterior fundal fibroid and that serosa incised down to the level of the fibroid. A combination of blunt and sharp dissection were undertaken to remove the fibroid from the myoma bed. The myoma bed was then closed with interrupted sutures of 0 Vicryl. A similar procedure was carried out with the remaining uterine fibroids, leaving the largest of them which was on the posterior fundus for last. Once hemostasis had been secured with the 0 Vicryl sutures in the myoma bed. The serosa over the 4 incisions in the uterus was reapproximated in an umbilicated fashion with a running suture of 3-0 Vicryl. Hemostasis was noted to be adequate. Copious irrigation was carried out.   Interceed was placed over each of the 4 uterine incisions and the uterus placed back into the abdominal cavity. Approximately 60 cc of warm lactated Ringer's was left in the peritoneal cavity. The abdominal peritoneum was closed with a running suture of 2-0 Vicryl. The rectus fascia was closed with running suture of 0 Vicryl from each apex to the midline and tied in the midline. The subcutaneous tissue was made hemostatic with Bovie cautery and irrigated. The skin incision was closed with a subcuticular suture of 3-0 Monocryl. Dermabond was applied to the  incision and a sterile dressing applied.   The patient was awakened from general anesthesia and taken to the recovery room in satisfactory condition having tolerated the procedure well with sponge and instrument counts correct.   Sequential compression devices were in place during the entire procedure.

## 2016-02-04 NOTE — Progress Notes (Signed)
Day of Surgery Procedure(s) (LRB): MYOMECTOMY (N/A)  Subjective: Patient reports no nausea or vomiting.   incisional pain controlled with single dose of IV dilaudid.  Tolerating PO.    Objective: I have reviewed patient's vital signs, intake and output and medications.  General: alert and cooperative Resp: clear to auscultation bilaterally Cardio: regular rate and rhythm, S1, S2 normal, no murmur, click, rub or gallop GI: soft, non-tender; bowel sounds normal; no masses,  no organomegaly and dressing: clean and dry Extremities: extremities normal, atraumatic, no cyanosis or edema Vaginal Bleeding: none  Assessment: s/p Procedure(s): MYOMECTOMY (N/A): stable  Plan: Advance diet Encourage ambulation Advance to PO medication Discontinue IV fluids in am if tolerating po's Discharge home 5/23 or 5/24 once benchmarks met.  LOS: 0 days    Jarry Manon P 02/04/2016, 6:49 PM

## 2016-02-05 ENCOUNTER — Encounter (HOSPITAL_COMMUNITY): Payer: Self-pay | Admitting: Obstetrics and Gynecology

## 2016-02-05 LAB — CBC
HEMATOCRIT: 31.1 % — AB (ref 36.0–46.0)
HEMOGLOBIN: 9.5 g/dL — AB (ref 12.0–15.0)
MCH: 24.1 pg — ABNORMAL LOW (ref 26.0–34.0)
MCHC: 30.5 g/dL (ref 30.0–36.0)
MCV: 78.7 fL (ref 78.0–100.0)
Platelets: 228 10*3/uL (ref 150–400)
RBC: 3.95 MIL/uL (ref 3.87–5.11)
RDW: 17.8 % — AB (ref 11.5–15.5)
WBC: 9.6 10*3/uL (ref 4.0–10.5)

## 2016-02-05 MED ORDER — IBUPROFEN 600 MG PO TABS: 600.0000 mg | ORAL_TABLET | Freq: Four times a day (QID) | ORAL | Status: DC

## 2016-02-05 MED ORDER — HYDROCODONE-ACETAMINOPHEN 5-325 MG PO TABS: 1.0000 | ORAL_TABLET | ORAL | Status: DC | PRN

## 2016-02-05 MED ORDER — DOCUSATE SODIUM 50 MG PO CAPS: 50.0000 mg | ORAL_CAPSULE | Freq: Two times a day (BID) | ORAL | Status: DC | PRN

## 2016-02-05 MED ORDER — SIMETHICONE 80 MG PO CHEW: 80.0000 mg | CHEWABLE_TABLET | Freq: Four times a day (QID) | ORAL | Status: DC | PRN

## 2016-02-05 MED ORDER — ONDANSETRON HCL 4 MG PO TABS: 4.0000 mg | ORAL_TABLET | Freq: Four times a day (QID) | ORAL | Status: DC | PRN

## 2016-02-05 NOTE — Discharge Summary (Signed)
.   Physician Discharge Summary  Patient ID: Barbara Randolph MRN: [id number] DOB/AGE: [birth year] 45 y.o.  Admit date: 02/04/2016 Discharge date: 02/05/2016   Discharge Diagnoses: Symptomatic Uterine Fibroids Active Problems:   Fibroids, submucosal   Anemia due to chronic blood loss   Operation: Abdominal Myomectomy   Discharged Condition: Good  Hospital Course: On the date of admission the patient underwent the aforementioned procedure and tolerated it well.  Post operative course was unremarkable with the patient resuming bowel and bladder  function by post operative day #1 and was therefore deemed ready for discharge home.  Discharge hemoglobin/hematocrit = 9.5/31.1 respectively.  Disposition: 01-Home or Self Care  Discharge Medications:    Medication List    TAKE these medications        docusate sodium 50 MG capsule  Commonly known as:  COLACE  Take 1 capsule (50 mg total) by mouth 2 (two) times daily as needed for mild constipation.     ferrous sulfate 325 (65 FE) MG tablet  Take 325 mg by mouth 2 (two) times daily with a meal.     HYDROcodone-acetaminophen 5-325 MG tablet  Commonly known as:  NORCO/VICODIN  Take 1-2 tablets by mouth every 4 (four) hours as needed for moderate pain or severe pain.     ibuprofen 600 MG tablet  Commonly known as:  ADVIL,MOTRIN  Take 1 tablet (600 mg total) by mouth every 6 (six) hours.     multivitamin with minerals Tabs tablet  Take 1 tablet by mouth daily.     ondansetron 4 MG tablet  Commonly known as:  ZOFRAN  Take 1 tablet (4 mg total) by mouth every 6 (six) hours as needed for nausea.     OVER THE COUNTER MEDICATION  Take 4 capsules by mouth daily. Fiber pills from whole foods     simethicone 80 MG chewable tablet  Commonly known as:  MYLICON  Chew 1 tablet (80 mg total) by mouth 4 (four) times daily as needed for flatulence.     tretinoin microspheres 0.1 % gel  Commonly known as:  RETIN-A MICRO  Apply 1 application  topically at bedtime. On face           Follow-up: Dr. Lorriane Shire P. Haygood on March 12, 2016 at 11 a.m.     SignedEarnstine Regal, PA-C 02/05/2016, 12:08 PM

## 2016-02-05 NOTE — Progress Notes (Signed)
Alvira Muha is a18 y.o.  [id number]  Post Op Date # 1: Abdominal Myomectomy  Subjective: Patient is Doing well postoperatively. Patient has Pain is controlled with current analgesics. Medications being used: single dose  Dilaudid.Marland Kitchen and IV Ketorolac.  She received  Tolerating liquids and had potato chips last night.  No complaints of nausea since yesterday.  Ambulating in halls  but reported soreness with ambulation and initially didn't want her Foley removed because of this.  Later the patient consented to removing the Foley and she was advised to ask for assistance, if needed, when she ambulated. Hasn't voided nor has she passed flatus.  Objective: Vital signs in last 24 hours: Temp:  [97.7 F (36.5 C)-99.3 F (37.4 C)] 99.3 F (37.4 C) (05/23 0600) Pulse Rate:  [59-88] 86 (05/23 0600) Resp:  [11-18] 18 (05/23 0600) BP: (98-125)/(51-68) 120/64 mmHg (05/23 0600) SpO2:  [90 %-100 %] 99 % (05/23 0600) Weight:  [193 lb (87.544 kg)] 193 lb (87.544 kg) (05/22 1416)  Intake/Output from previous day: 05/22 0701 - 05/23 0700 In: 5224 [P.O.:360; I.V.:4864] Out: 4283 [Urine:3783] Intake/Output this shift:    Recent Labs Lab 01/30/16 1430 02/05/16 0551  WBC 5.1 9.6  HGB 10.7* 9.5*  HCT 35.5* 31.1*  PLT 330 228    EXAM: General: alert, cooperative and no distress Resp: clear to auscultation bilaterally Cardio: regular rate and rhythm, S1, S2 normal, no murmur, click, rub or gallop GI: Decreased bowel sounds;  dressing is clean/dry/intact. Extremities: Homans sign is negative, no sign of DVT and no calf tenderness.   Assessment: s/p Procedure(s): MYOMECTOMY: stable, progressing well and hemodynamically stable with anemia consistent with chronic anemia and appropriate for blood loss  Plan: Advance diet Encourage ambulation Advance to PO medication Routine care. Discharge home after voiding and tolerating po pain med.  LOS: 1 day    POWELL,ELMIRA, PA-C 02/05/2016 7:45  AM

## 2016-02-05 NOTE — Anesthesia Postprocedure Evaluation (Signed)
Anesthesia Post Note  Patient: Barbara Randolph  Procedure(s) Performed: Procedure(s) (LRB): MYOMECTOMY (N/A)  Patient location during evaluation: PACU Anesthesia Type: General Level of consciousness: awake and sedated Pain management: pain level controlled Vital Signs Assessment: post-procedure vital signs reviewed and stable Respiratory status: spontaneous breathing Cardiovascular status: stable Postop Assessment: no signs of nausea or vomiting Anesthetic complications: no     Last Vitals:  Filed Vitals:   02/05/16 0155 02/05/16 0600  BP: 98/51 120/64  Pulse: 88 86  Temp: 37.4 C 37.4 C  Resp: 16 18    Last Pain:  Filed Vitals:   02/05/16 0645  PainSc: Asleep   Pain Goal: Patients Stated Pain Goal: 3 (02/05/16 0452)               Mikeala Girdler JR,JOHN Mateo Flow

## 2016-02-05 NOTE — Discharge Instructions (Signed)
Call [facility] @ [phone] if:  You have a temperature greater than or equal to 100.4 degrees Farenheit orally You have pain that is not made better by the pain medication given and taken as directed You have excessive bleeding or problems urinating  Take Colace (Docusate Sodium/Stool Softener) 100 mg 2-3 times daily while taking narcotic pain medicine to avoid constipation or until bowel movements are regular. Take Ibuprofen 600 mg with food every 6 hours for 5 days then as needed for pain Take over the counter iron supplement (of your choice)   twice a day for the next 12 weeks  You may drive after 2  weeks You may walk up steps  You may shower  You may resume a regular diet  Keep incisions clean and dry;  removed honeycomb dressing on Feb 11, 2016. Do not lift over 15 pounds for 6 weeks Avoid anything in vagina for 6 weeks (or until after your post-operative visit)

## 2016-02-05 NOTE — Progress Notes (Signed)
Pt has an order to d/c foley , pt tolerated walking fairly and does not feel comfortable for her foley to come out until  she tolerates walking well. She asked RN to check with her in two  Hours.

## 2016-03-27 ENCOUNTER — Other Ambulatory Visit: Payer: Self-pay | Admitting: Obstetrics and Gynecology

## 2016-03-27 DIAGNOSIS — N63 Unspecified lump in unspecified breast: Secondary | ICD-10-CM

## 2016-04-02 ENCOUNTER — Ambulatory Visit
Admission: RE | Admit: 2016-04-02 | Discharge: 2016-04-02 | Disposition: A | Discharge status: Home / Self Care | Payer: 59 | Source: Ambulatory Visit | Attending: Obstetrics and Gynecology | Admitting: Obstetrics and Gynecology

## 2016-04-02 ENCOUNTER — Other Ambulatory Visit: Payer: Self-pay | Admitting: Obstetrics and Gynecology

## 2016-04-02 DIAGNOSIS — N63 Unspecified lump in unspecified breast: Secondary | ICD-10-CM

## 2019-08-23 ENCOUNTER — Other Ambulatory Visit: Payer: Self-pay | Admitting: Nurse Practitioner

## 2019-08-23 ENCOUNTER — Other Ambulatory Visit: Payer: Self-pay | Admitting: Family Medicine

## 2019-08-23 DIAGNOSIS — Z1231 Encounter for screening mammogram for malignant neoplasm of breast: Secondary | ICD-10-CM

## 2019-11-25 ENCOUNTER — Other Ambulatory Visit: Payer: Self-pay | Admitting: Nurse Practitioner

## 2019-11-25 DIAGNOSIS — N6310 Unspecified lump in the right breast, unspecified quadrant: Secondary | ICD-10-CM

## 2019-12-16 ENCOUNTER — Other Ambulatory Visit: Payer: Self-pay

## 2019-12-16 ENCOUNTER — Ambulatory Visit
Admission: RE | Admit: 2019-12-16 | Discharge: 2019-12-16 | Disposition: A | Discharge status: Home / Self Care | Payer: 59 | Source: Ambulatory Visit | Attending: Nurse Practitioner | Admitting: Nurse Practitioner

## 2019-12-16 ENCOUNTER — Other Ambulatory Visit: Payer: Self-pay | Admitting: Nurse Practitioner

## 2019-12-16 DIAGNOSIS — N6310 Unspecified lump in the right breast, unspecified quadrant: Secondary | ICD-10-CM

## 2019-12-22 ENCOUNTER — Other Ambulatory Visit: Payer: Self-pay

## 2019-12-22 ENCOUNTER — Ambulatory Visit
Admission: RE | Admit: 2019-12-22 | Discharge: 2019-12-22 | Disposition: A | Discharge status: Home / Self Care | Payer: BLUE CROSS/BLUE SHIELD | Source: Ambulatory Visit | Attending: Nurse Practitioner | Admitting: Nurse Practitioner

## 2019-12-22 DIAGNOSIS — N6310 Unspecified lump in the right breast, unspecified quadrant: Secondary | ICD-10-CM

## 2020-09-24 ENCOUNTER — Other Ambulatory Visit: Payer: Self-pay

## 2020-09-24 ENCOUNTER — Emergency Department (HOSPITAL_COMMUNITY): Payer: HRSA Program

## 2020-09-24 ENCOUNTER — Encounter (HOSPITAL_COMMUNITY): Payer: Self-pay

## 2020-09-24 ENCOUNTER — Inpatient Hospital Stay (HOSPITAL_COMMUNITY)
Admission: EM | Admit: 2020-09-24 | Discharge: 2020-10-01 | DRG: 177 | Disposition: A | Discharge status: Home / Self Care | Payer: HRSA Program | Attending: Internal Medicine | Admitting: Internal Medicine

## 2020-09-24 DIAGNOSIS — R7989 Other specified abnormal findings of blood chemistry: Secondary | ICD-10-CM | POA: Diagnosis present

## 2020-09-24 DIAGNOSIS — R0902 Hypoxemia: Secondary | ICD-10-CM

## 2020-09-24 DIAGNOSIS — J1282 Pneumonia due to coronavirus disease 2019: Secondary | ICD-10-CM | POA: Diagnosis present

## 2020-09-24 DIAGNOSIS — U071 COVID-19: Secondary | ICD-10-CM | POA: Diagnosis present

## 2020-09-24 DIAGNOSIS — J9601 Acute respiratory failure with hypoxia: Secondary | ICD-10-CM | POA: Diagnosis present

## 2020-09-24 DIAGNOSIS — D509 Iron deficiency anemia, unspecified: Secondary | ICD-10-CM | POA: Diagnosis present

## 2020-09-24 DIAGNOSIS — I10 Essential (primary) hypertension: Secondary | ICD-10-CM | POA: Diagnosis present

## 2020-09-24 LAB — COMPREHENSIVE METABOLIC PANEL
ALT: 17 U/L (ref 0–44)
AST: 36 U/L (ref 15–41)
Albumin: 3.3 g/dL — ABNORMAL LOW (ref 3.5–5.0)
Alkaline Phosphatase: 57 U/L (ref 38–126)
Anion gap: 9 (ref 5–15)
BUN: 15 mg/dL (ref 6–20)
CO2: 26 mmol/L (ref 22–32)
Calcium: 8.2 mg/dL — ABNORMAL LOW (ref 8.9–10.3)
Chloride: 104 mmol/L (ref 98–111)
Creatinine, Ser: 0.88 mg/dL (ref 0.44–1.00)
GFR, Estimated: >60 mL/min (ref >60)
Glucose, Bld: 122 mg/dL — ABNORMAL HIGH (ref 70–99)
Potassium: 4.2 mmol/L (ref 3.5–5.1)
Sodium: 139 mmol/L (ref 135–145)
Total Bilirubin: 0.3 mg/dL (ref 0.3–1.2)
Total Protein: 7.3 g/dL (ref 6.5–8.1)

## 2020-09-24 LAB — I-STAT BETA HCG BLOOD, ED (MC, WL, AP ONLY): I-stat hCG, quantitative: <5 m[IU]/mL (ref <5)

## 2020-09-24 LAB — CBC WITH DIFFERENTIAL/PLATELET
Abs Immature Granulocytes: 0.05 10*3/uL (ref 0.00–0.07)
Basophils Absolute: 0 10*3/uL (ref 0.0–0.1)
Basophils Relative: 0 %
Eosinophils Absolute: 0 10*3/uL (ref 0.0–0.5)
Eosinophils Relative: 0 %
HCT: 29.8 % — ABNORMAL LOW (ref 36.0–46.0)
Hemoglobin: 8.2 g/dL — ABNORMAL LOW (ref 12.0–15.0)
Immature Granulocytes: 1 %
Lymphocytes Relative: 15 %
Lymphs Abs: 0.7 10*3/uL (ref 0.7–4.0)
MCH: 19.7 pg — ABNORMAL LOW (ref 26.0–34.0)
MCHC: 27.5 g/dL — ABNORMAL LOW (ref 30.0–36.0)
MCV: 71.5 fL — ABNORMAL LOW (ref 80.0–100.0)
Monocytes Absolute: 0.2 10*3/uL (ref 0.1–1.0)
Monocytes Relative: 4 %
Neutro Abs: 3.7 10*3/uL (ref 1.7–7.7)
Neutrophils Relative %: 80 %
Platelets: 281 10*3/uL (ref 150–400)
RBC: 4.17 MIL/uL (ref 3.87–5.11)
RDW: 19.3 % — ABNORMAL HIGH (ref 11.5–15.5)
WBC: 4.6 10*3/uL (ref 4.0–10.5)
nRBC: 0.7 % — ABNORMAL HIGH (ref 0.0–0.2)

## 2020-09-24 LAB — C-REACTIVE PROTEIN: CRP: 14 mg/dL — ABNORMAL HIGH (ref <1.0)

## 2020-09-24 LAB — RESP PANEL BY RT-PCR (FLU A&B, COVID) ARPGX2
Influenza A by PCR: NEGATIVE
Influenza B by PCR: NEGATIVE
SARS Coronavirus 2 by RT PCR: POSITIVE — AB

## 2020-09-24 LAB — TRIGLYCERIDES: Triglycerides: 92 mg/dL (ref <150)

## 2020-09-24 LAB — FIBRINOGEN: Fibrinogen: 609 mg/dL — ABNORMAL HIGH (ref 210–475)

## 2020-09-24 LAB — PROCALCITONIN: Procalcitonin: <0.1 ng/mL

## 2020-09-24 LAB — RAPID HIV SCREEN (HIV 1/2 AB+AG)
HIV 1/2 Antibodies: NONREACTIVE
HIV-1 P24 Antigen - HIV24: NONREACTIVE

## 2020-09-24 LAB — LACTATE DEHYDROGENASE: LDH: 389 U/L — ABNORMAL HIGH (ref 98–192)

## 2020-09-24 LAB — POC SARS CORONAVIRUS 2 AG -  ED: SARS Coronavirus 2 Ag: NEGATIVE

## 2020-09-24 LAB — LACTIC ACID, PLASMA: Lactic Acid, Venous: 1.1 mmol/L (ref 0.5–1.9)

## 2020-09-24 LAB — D-DIMER, QUANTITATIVE: D-Dimer, Quant: 0.81 ug/mL-FEU — ABNORMAL HIGH (ref 0.00–0.50)

## 2020-09-24 LAB — FERRITIN: Ferritin: 45 ng/mL (ref 11–307)

## 2020-09-24 MED ORDER — SODIUM CHLORIDE 0.9 % IV SOLN
100.0000 mg | Freq: Every day | INTRAVENOUS | Status: AC
  Administered 2020-09-25 – 2020-09-28 (×4): 100 mg via INTRAVENOUS
  Filled 2020-09-24 (×4): qty 20

## 2020-09-24 MED ORDER — ENOXAPARIN SODIUM 40 MG/0.4ML ~~LOC~~ SOLN
40.0000 mg | SUBCUTANEOUS | Status: DC
  Administered 2020-09-25 – 2020-10-01 (×7): 40 mg via SUBCUTANEOUS
  Filled 2020-09-24 (×7): qty 0.4

## 2020-09-24 MED ORDER — SENNOSIDES-DOCUSATE SODIUM 8.6-50 MG PO TABS: 1.0000 | ORAL_TABLET | Freq: Every evening | ORAL | Status: DC | PRN

## 2020-09-24 MED ORDER — PREDNISONE 20 MG PO TABS
50.0000 mg | ORAL_TABLET | Freq: Every day | ORAL | Status: DC
  Administered 2020-09-28 – 2020-10-01 (×4): 50 mg via ORAL
  Filled 2020-09-24 (×4): qty 2

## 2020-09-24 MED ORDER — METHYLPREDNISOLONE SODIUM SUCC 125 MG IJ SOLR
125.0000 mg | Freq: Once | INTRAMUSCULAR | Status: AC
  Administered 2020-09-24: 125 mg via INTRAVENOUS
  Filled 2020-09-24: qty 2

## 2020-09-24 MED ORDER — LACTATED RINGERS IV SOLN: INTRAVENOUS | Status: DC

## 2020-09-24 MED ORDER — METHYLPREDNISOLONE SODIUM SUCC 125 MG IJ SOLR
80.0000 mg | Freq: Two times a day (BID) | INTRAMUSCULAR | Status: AC
  Administered 2020-09-25 – 2020-09-27 (×5): 80 mg via INTRAVENOUS
  Filled 2020-09-24 (×5): qty 2

## 2020-09-24 MED ORDER — ALBUTEROL SULFATE HFA 108 (90 BASE) MCG/ACT IN AERS: 2.0000 | INHALATION_SPRAY | RESPIRATORY_TRACT | Status: DC | PRN

## 2020-09-24 MED ORDER — ONDANSETRON HCL 4 MG PO TABS: 4.0000 mg | ORAL_TABLET | Freq: Four times a day (QID) | ORAL | Status: DC | PRN

## 2020-09-24 MED ORDER — SODIUM CHLORIDE 0.9 % IV SOLN
200.0000 mg | Freq: Once | INTRAVENOUS | Status: AC
  Administered 2020-09-24: 200 mg via INTRAVENOUS
  Filled 2020-09-24: qty 200

## 2020-09-24 MED ORDER — GUAIFENESIN-DM 100-10 MG/5ML PO SYRP: 10.0000 mL | ORAL_SOLUTION | ORAL | Status: DC | PRN

## 2020-09-24 MED ORDER — DEXAMETHASONE SODIUM PHOSPHATE 10 MG/ML IJ SOLN
6.0000 mg | Freq: Once | INTRAMUSCULAR | Status: DC
  Filled 2020-09-24: qty 1

## 2020-09-24 MED ORDER — ONDANSETRON HCL 4 MG/2ML IJ SOLN: 4.0000 mg | Freq: Four times a day (QID) | INTRAMUSCULAR | Status: DC | PRN

## 2020-09-24 MED ORDER — ACETAMINOPHEN 325 MG PO TABS: 650.0000 mg | ORAL_TABLET | Freq: Four times a day (QID) | ORAL | Status: DC | PRN

## 2020-09-24 MED ORDER — HYDROCOD POLST-CPM POLST ER 10-8 MG/5ML PO SUER: 5.0000 mL | Freq: Two times a day (BID) | ORAL | Status: DC | PRN

## 2020-09-24 MED ORDER — LOSARTAN POTASSIUM 50 MG PO TABS
50.0000 mg | ORAL_TABLET | Freq: Every day | ORAL | Status: DC
  Administered 2020-09-25 – 2020-10-01 (×7): 50 mg via ORAL
  Filled 2020-09-24 (×5): qty 1
  Filled 2020-09-24 (×2): qty 2

## 2020-09-24 NOTE — ED Triage Notes (Signed)
Pt presents with c/o possible Covid exposure. Pt reports she has had some shortness of breath for 2 weeks. Pt says she took a covid test and it was undetectable but she does not believe that to be right. Pt was 84% on RA upon arrival to triage room, 92% on 2L.

## 2020-09-24 NOTE — ED Notes (Signed)
Date and time results received: 09/24/20 10:05 PM (use smartphrase ".now" to insert current time)  Test: HIV Critical Value: Non reactive  Name of Provider Notified: Geiple  Orders Received? Or Actions Taken?:

## 2020-09-24 NOTE — H&P (Incomplete)
History and Physical    Barbara Randolph [id number] DOB: Dec 06, 1970 DOA: 09/24/2020  PCP: Donald Prose, MD   Patient coming from: ***  Chief Complaint: ***  HPI: Barbara Randolph is a 50 y.o. female with medical history significant for***   ED Course: ***  Review of Systems:  General: Denies weakness, fever, chills, weight loss, night sweats.  Denies dizziness.  Denies change in appetite HENT: Denies head trauma, headache, denies change in hearing, tinnitus.  Denies nasal congestion or bleeding.  Denies sore throat, sores in mouth.  Denies difficulty swallowing Eyes: Denies blurry vision, pain in eye, drainage.  Denies discoloration of eyes. Neck: Denies pain.  Denies swelling.  Denies pain with movement. Cardiovascular: Denies chest pain, palpitations.  Denies edema.  Denies orthopnea Respiratory: Denies shortness of breath, cough.  Denies wheezing.  Denies sputum production Gastrointestinal: Denies abdominal pain, swelling.  Denies nausea, vomiting, diarrhea.  Denies melena.  Denies hematemesis. Musculoskeletal: Denies limitation of movement.  Denies deformity or swelling.  Denies pain.  Denies arthralgias or myalgias. Genitourinary: Denies pelvic pain.  Denies urinary frequency or hesitancy.  Denies dysuria.  Skin: Denies rash.  Denies petechiae, purpura, ecchymosis. Neurological: Denies headache.  Denies syncope.  Denies seizure activity.  Denies weakness or paresthesia.  Denies slurred speech, drooping face.  Denies visual change. Psychiatric: Denies depression, anxiety.  Denies suicidal thoughts or ideation.  Denies hallucinations.  Past Medical History:  Diagnosis Date  . Fibroids   . Medical history non-contributory     Past Surgical History:  Procedure Laterality Date  . FRACTURE SURGERY Right    hip  . MYOMECTOMY N/A 02/04/2016   Procedure: MYOMECTOMY;  Surgeon: Eldred Manges, MD;  Location: [facility];  Service: Gynecology;  Laterality: N/A;    Social History   reports that she has never smoked. She has never used smokeless tobacco. She reports that she does not drink alcohol and does not use drugs.  No Known Allergies  Family History  Problem Relation Age of Onset  . Breast cancer Maternal Aunt      Prior to Admission medications   Medication Sig Start Date End Date Taking? Authorizing Provider  acetaminophen (TYLENOL) 500 MG tablet Take 1,000 mg by mouth every 6 (six) hours as needed.   Yes [provider]  losartan (COZAAR) 50 MG tablet Take 50 mg by mouth daily.   Yes [provider]  Multiple Vitamin (MULTIVITAMIN WITH MINERALS) TABS Take 1 tablet by mouth daily.   Yes [provider]  docusate sodium (COLACE) 50 MG capsule Take 1 capsule (50 mg total) by mouth 2 (two) times daily as needed for mild constipation. Patient not taking: No sig reported 02/05/16   Haygood, Seymour Bars, MD  HYDROcodone-acetaminophen (NORCO/VICODIN) 5-325 MG tablet Take 1-2 tablets by mouth every 4 (four) hours as needed for moderate pain or severe pain. Patient not taking: No sig reported 02/05/16   Haygood, Seymour Bars, MD  ibuprofen (ADVIL,MOTRIN) 600 MG tablet Take 1 tablet (600 mg total) by mouth every 6 (six) hours. Patient not taking: No sig reported 02/05/16   Haygood, Seymour Bars, MD  ondansetron (ZOFRAN) 4 MG tablet Take 1 tablet (4 mg total) by mouth every 6 (six) hours as needed for nausea. Patient not taking: No sig reported 02/05/16   Haygood, Seymour Bars, MD  simethicone (MYLICON) 80 MG chewable tablet Chew 1 tablet (80 mg total) by mouth 4 (four) times daily as needed for flatulence. Patient not taking: No sig reported 02/05/16  Eldred Manges, MD    Physical Exam: Vitals:   09/24/20 2130 09/24/20 2145 09/24/20 2200 09/24/20 2315  BP: 116/74 118/72 115/73 111/70  Pulse: 73 72 76 73  Resp: (!) 28 (!) 32 (!) 29 (!) 29  Temp:      TempSrc:      SpO2: 98% 100% 98% 93%    Constitutional: NAD, calm, comfortable Vitals:    09/24/20 2130 09/24/20 2145 09/24/20 2200 09/24/20 2315  BP: 116/74 118/72 115/73 111/70  Pulse: 73 72 76 73  Resp: (!) 28 (!) 32 (!) 29 (!) 29  Temp:      TempSrc:      SpO2: 98% 100% 98% 93%   General: WDWN, Alert and oriented x3.  Eyes: EOMI, PERRL, lids and conjunctivae normal.  Sclera nonicteric HENT:  [place]/AT, external ears normal.  Nares patent without epistasis.  Mucous membranes are moist. Posterior pharynx clear of any exudate or lesions. Normal dentition.  Neck: Soft, normal range of motion, supple, no masses, no thyromegaly.  Trachea midline Respiratory: clear to auscultation bilaterally, no wheezing, no crackles. Normal respiratory effort. No accessory muscle use.  Cardiovascular: Regular rate and rhythm, no murmurs / rubs / gallops. No extremity edema. 2+ pedal pulses. No carotid bruits.  Abdomen: Soft, no tenderness, nondistended, no rebound or guarding.  No masses palpated. No hepatosplenomegaly. Bowel sounds normoactive Musculoskeletal: FROM. no clubbing / cyanosis. No joint deformity upper and lower extremities. no contractures. Normal muscle tone.  Skin: Warm, dry, intact no rashes, lesions, ulcers. No induration Neurologic: CN 2-12 grossly intact.  Normal speech.  Sensation intact, patella DTR +1 bilaterally. Strength 5/5 in all extremities.   Psychiatric: Normal judgment and insight.  Normal mood.    Labs on Admission: I have personally reviewed following labs and imaging studies  CBC: Recent Labs  Lab 09/24/20 1939  WBC 4.6  NEUTROABS 3.7  HGB 8.2*  HCT 29.8*  MCV 71.5*  PLT 409    Basic Metabolic Panel: Recent Labs  Lab 09/24/20 2040  NA 139  K 4.2  CL 104  CO2 26  GLUCOSE 122*  BUN 15  CREATININE 0.88  CALCIUM 8.2*    GFR: CrCl cannot be calculated (Unknown ideal weight.).  Liver Function Tests: Recent Labs  Lab 09/24/20 2040  AST 36  ALT 17  ALKPHOS 57  BILITOT 0.3  PROT 7.3  ALBUMIN 3.3*    Urine analysis: No results found for:  COLORURINE, APPEARANCEUR, LABSPEC, PHURINE, GLUCOSEU, HGBUR, BILIRUBINUR, KETONESUR, PROTEINUR, UROBILINOGEN, NITRITE, LEUKOCYTESUR  Radiological Exams on Admission: DG Chest Port 1 View  Result Date: 09/24/2020 CLINICAL DATA:  COVID symptoms. Possible COVID exposure. Shortness of breath. EXAM: PORTABLE CHEST 1 VIEW COMPARISON:  None. FINDINGS: Lung volumes are low. Upper normal heart size is likely related to low lung volumes. Rather extensive heterogeneous bilateral lung opacities, right greater than left. Trace fluid in the right minor fissure. No pneumothorax or large pleural effusion. No acute osseous abnormalities are seen. IMPRESSION: 1. Rather extensive bilateral heterogeneous lung opacities, right greater than left, consistent with COVID pneumonia. 2. Trace fluid in the right minor fissure. 3. Low lung volumes. Electronically Signed   By: Keith Rake M.D.   On: 09/24/2020 20:04    EKG: Independently reviewed. ***  Assessment/Plan Principal Problem:   Pneumonia due to COVID-19 virus Sarted on remdesivir Supplemental oxygen provided keep O2 sat between 92 to 96% Albuterol MDI every 4 hours as needed for wheezing cough, shortness of breath Antitussives provided Incentive spirometer  every 1-2 hours while awake Discussed with patient possibility of needing to add Actemra or baricitinib to his regimen if respiratory status deteriorates.  Discussed pros and cons of therapy.  Patient has no history of TB, immunosuppression, cancer, diverticulitis, hepatitis.  She is in agreement to have this therapy if it is required  Active Problems:   Hypoxia Surg as above.  Albuterol MDI as above. Patient has elevated D-dimer level will obtain CT angiography of chest to rule out PE since Covid is a prothrombotic condition.  If PE is identified patient will be placed on therapeutic anticoagulation   Hypochromic microcytic anemia   Essential hypertension     DVT prophylaxis: Lovenox for DVT  prophylaxis Code Status:   Full code Family Communication:  Diagnosis and plan discussed with patient she agrees and.  Questions answered.  Further recommendations to follow as clinically indicated Disposition Plan:   Patient is from:  Home  Anticipated DC to:  Home  Anticipated DC date:  Anticipate more than 2 midnight stay in hospital to treat acute condition  Anticipated DC barriers: No barriers to discharge identified at this time  Admission status:  Inpatient   Yevonne Aline Belkis Norbeck MD [facility]  How to contact the [facility] Attending or Consulting provider [place] or covering provider during after hours [place], for this patient?   1. Check the care team in [facility] and look for a) attending/consulting [facility] provider listed and b) the [facility] team listed 2. Log into [email or web address] and use [place]'s universal password to access. If you do not have the password, please contact the hospital operator. 3. Locate the [facility] provider you are looking for under [facility] and page to a number that you can be directly reached. 4. If you still have difficulty reaching the provider, please page the [facility] (Director on Call) for the Hospitalists listed on amion for assistance.  09/24/2020, 11:55 PM

## 2020-09-24 NOTE — ED Notes (Signed)
Pt bumped up to 3L of O2 prior to being moved to the lobby.

## 2020-09-24 NOTE — ED Provider Notes (Signed)
[facility] DEPT Provider Note   CSN: [id number] Arrival date & time: 09/24/20  1814     History Chief Complaint  Patient presents with  . Sore Throat  . Covid Exposure    Barbara Randolph is a 50 y.o. female.  Patient with no significant past medical history presents the emergency department for weakness and shortness of breath.  Patient states that she drove to [place] with family over Christmas.  She was around a family member who was later diagnosed with Covid.  About a week ago she developed generalized weakness and a sore throat.  She had some muscle aches.  No nausea, vomiting, or diarrhea.  No significant cough.  She reports intermittent fevers.  Over the past couple of days, her breathing has become worse and she feels very short of breath.  She states that she becomes very winded after walking across the room.  She denies lower extremity swelling or pain, history of blood clots.  She is not vaccinated against Covid.        Past Medical History:  Diagnosis Date  . Fibroids   . Medical history non-contributory     Patient Active Problem List   Diagnosis Date Noted  . Fibroids, submucosal 02/04/2016  . Anemia due to chronic blood loss 02/04/2016  . Submucous leiomyoma of uterus     Past Surgical History:  Procedure Laterality Date  . FRACTURE SURGERY Right    hip  . MYOMECTOMY N/A 02/04/2016   Procedure: MYOMECTOMY;  Surgeon: Eldred Manges, MD;  Location: [facility];  Service: Gynecology;  Laterality: N/A;     OB History   No obstetric history on file.     Family History  Problem Relation Age of Onset  . Breast cancer Maternal Aunt     Social History   Tobacco Use  . Smoking status: Never Smoker  . Smokeless tobacco: Never Used  Substance Use Topics  . Alcohol use: No    Alcohol/week: 0.0 standard drinks  . Drug use: No    Home Medications Prior to Admission medications   Medication Sig Start Date End Date Taking?  Authorizing Provider  docusate sodium (COLACE) 50 MG capsule Take 1 capsule (50 mg total) by mouth 2 (two) times daily as needed for mild constipation. 02/05/16   Haygood, Seymour Bars, MD  ferrous sulfate 325 (65 FE) MG tablet Take 325 mg by mouth 2 (two) times daily with a meal.    [provider]  HYDROcodone-acetaminophen (NORCO/VICODIN) 5-325 MG tablet Take 1-2 tablets by mouth every 4 (four) hours as needed for moderate pain or severe pain. 02/05/16   Haygood, Seymour Bars, MD  ibuprofen (ADVIL,MOTRIN) 600 MG tablet Take 1 tablet (600 mg total) by mouth every 6 (six) hours. 02/05/16   Haygood, Seymour Bars, MD  Multiple Vitamin (MULTIVITAMIN WITH MINERALS) TABS Take 1 tablet by mouth daily.    [provider]  ondansetron (ZOFRAN) 4 MG tablet Take 1 tablet (4 mg total) by mouth every 6 (six) hours as needed for nausea. 02/05/16   Haygood, Seymour Bars, MD  OVER THE COUNTER MEDICATION Take 4 capsules by mouth daily. Fiber pills from whole foods    [provider]  simethicone (MYLICON) 80 MG chewable tablet Chew 1 tablet (80 mg total) by mouth 4 (four) times daily as needed for flatulence. 02/05/16   Haygood, Seymour Bars, MD  tretinoin microspheres (RETIN-A MICRO) 0.1 % gel Apply 1 application topically at bedtime. On face  [provider]    Allergies    Patient has no known allergies.  Review of Systems   Review of Systems  Constitutional: Positive for fatigue. Negative for fever.  HENT: Positive for sore throat. Negative for rhinorrhea.   Eyes: Negative for redness.  Respiratory: Positive for shortness of breath. Negative for cough.   Cardiovascular: Negative for chest pain.  Gastrointestinal: Negative for abdominal pain, diarrhea, nausea and vomiting.  Genitourinary: Negative for dysuria, frequency, hematuria and urgency.  Musculoskeletal: Positive for myalgias.  Skin: Negative for rash.  Neurological: Positive for headaches.    Physical Exam Updated Vital  Signs BP 113/70   Pulse 73   Temp 98.3 F (36.8 C) (Oral)   Resp (!) 32   LMP 09/10/2020   SpO2 100%   Physical Exam Vitals and nursing note reviewed.  Constitutional:      General: She is not in acute distress.    Appearance: She is well-developed.  HENT:     Head: Normocephalic and atraumatic.     Right Ear: External ear normal.     Left Ear: External ear normal.     Nose: Nose normal.  Eyes:     Conjunctiva/sclera: Conjunctivae normal.  Cardiovascular:     Rate and Rhythm: Normal rate and regular rhythm.     Heart sounds: No murmur heard.   Pulmonary:     Effort: Tachypnea present. No respiratory distress.     Breath sounds: Examination of the right-middle field reveals rales. Examination of the left-middle field reveals rales. Examination of the right-lower field reveals rales. Examination of the left-lower field reveals rales. Rales present. No wheezing or rhonchi.  Abdominal:     Palpations: Abdomen is soft.     Tenderness: There is no abdominal tenderness. There is no guarding or rebound.  Musculoskeletal:     Cervical back: Normal range of motion and neck supple.     Right lower leg: No edema.     Left lower leg: No edema.  Skin:    General: Skin is warm and dry.     Findings: No rash.  Neurological:     General: No focal deficit present.     Mental Status: She is alert. Mental status is at baseline.     Motor: No weakness.  Psychiatric:        Mood and Affect: Mood normal.     ED Results / Procedures / Treatments   Labs (all labs ordered are listed, but only abnormal results are displayed) Labs Reviewed  RESP PANEL BY RT-PCR (FLU A&B, COVID) ARPGX2 - Abnormal; Notable for the following components:      Result Value   SARS Coronavirus 2 by RT PCR POSITIVE (*)    All other components within normal limits  CBC WITH DIFFERENTIAL/PLATELET - Abnormal; Notable for the following components:   Hemoglobin 8.2 (*)    HCT 29.8 (*)    MCV 71.5 (*)    MCH 19.7  (*)    MCHC 27.5 (*)    RDW 19.3 (*)    nRBC 0.7 (*)    All other components within normal limits  D-DIMER, QUANTITATIVE (NOT AT [facility]) - Abnormal; Notable for the following components:   D-Dimer, Quant 0.81 (*)    All other components within normal limits  LACTATE DEHYDROGENASE - Abnormal; Notable for the following components:   LDH 389 (*)    All other components within normal limits  FIBRINOGEN - Abnormal; Notable for the following components:  Fibrinogen 609 (*)    All other components within normal limits  C-REACTIVE PROTEIN - Abnormal; Notable for the following components:   CRP 14.0 (*)    All other components within normal limits  COMPREHENSIVE METABOLIC PANEL - Abnormal; Notable for the following components:   Glucose, Bld 122 (*)    Calcium 8.2 (*)    Albumin 3.3 (*)    All other components within normal limits  CULTURE, BLOOD (ROUTINE X 2)  CULTURE, BLOOD (ROUTINE X 2)  LACTIC ACID, PLASMA  PROCALCITONIN  FERRITIN  TRIGLYCERIDES  RAPID HIV SCREEN (HIV 1/2 AB+AG)  POC SARS CORONAVIRUS 2 AG -  ED  I-STAT BETA HCG BLOOD, ED ([facility], [facility], [facility] ONLY)    EKG None  Radiology DG Chest Port 1 View  Result Date: 09/24/2020 CLINICAL DATA:  COVID symptoms. Possible COVID exposure. Shortness of breath. EXAM: PORTABLE CHEST 1 VIEW COMPARISON:  None. FINDINGS: Lung volumes are low. Upper normal heart size is likely related to low lung volumes. Rather extensive heterogeneous bilateral lung opacities, right greater than left. Trace fluid in the right minor fissure. No pneumothorax or large pleural effusion. No acute osseous abnormalities are seen. IMPRESSION: 1. Rather extensive bilateral heterogeneous lung opacities, right greater than left, consistent with COVID pneumonia. 2. Trace fluid in the right minor fissure. 3. Low lung volumes. Electronically Signed   By: Keith Rake M.D.   On: 09/24/2020 20:04    Procedures Procedures (including critical care time)  Medications Ordered  in ED Medications - No data to display  ED Course  I have reviewed the triage vital signs and the nursing notes.  Pertinent labs & imaging results that were available during my care of the patient were reviewed by me and considered in my medical decision making (see chart for details).  Patient seen and examined. Work-up initiated. Covid Ag neg. PCR ordered. CXR suggestive of Covid PNA.   Vital signs reviewed and are as follows: BP 115/73   Pulse 76   Temp 98.3 F (36.8 C) (Oral)   Resp (!) 29   LMP 09/10/2020   SpO2 98%   11:12 PM Patient has been stable on 4L [place]. PCR Covid positive. Plan admit. Steroids ordered.   11:15 PM Spoke with Dr. Tonie Griffith who will see patient.   Barbara Randolph was evaluated in Emergency Department on 09/24/2020 for the symptoms described in the history of present illness. She was evaluated in the context of the global COVID-19 pandemic, which necessitated consideration that the patient might be at risk for infection with the SARS-CoV-2 virus that causes COVID-19. Institutional protocols and algorithms that pertain to the evaluation of patients at risk for COVID-19 are in a state of rapid change based on information released by regulatory bodies including the CDC and federal and state organizations. These policies and algorithms were followed during the patient's care in the ED.      MDM Rules/Calculators/A&P                          Admit.   Final Clinical Impression(s) / ED Diagnoses Final diagnoses:  Acute respiratory failure with hypoxia ([place])  Pneumonia due to COVID-19 virus    Rx / DC Orders ED Discharge Orders    None       Carlisle Cater, PA-C 09/24/20 2316    Blanchie Dessert, MD 09/24/20 2341

## 2020-09-24 NOTE — H&P (Signed)
History and Physical    Barbara Randolph FAO:[id number] DOB: 09-Jun-1971 DOA: 09/24/2020  PCP: Donald Prose, MD   Patient coming from:   Chief Complaint: SOB, cough  HPI: Barbara Randolph is a 50 y.o. female with medical history significant for HTN who presents for evaluation of weakness and shortness of breath.  Patient states that she drove to [place] with family over Christmas and was around a family member who was later diagnosed with Covid.  5-6 days ago she developed generalized weakness and a sore throat.  She had some muscle aches.  No nausea, vomiting, or diarrhea.  She reports a dry nonproductive cough. She reports intermittent fevers.    She states that she lost her sense of taste 2 to 3 days ago.  Over the past 3 days, her breathing has become worse and she feels very short of breath that is worsened with exertion.  That she has difficulty walking cross the room secondary to becoming very short of breath.  She denies lower extremity swelling or pain, history of blood clots.  She is not vaccinated against Covid.  She denies tobacco use  ED Course: She is found to be Covid positive.  She has elevated D-dimer and CRP.  She is requiring 4 L of oxygen nasal cannula to maintain O2 saturation.  X-ray is consistent with Covid pneumonia in both lung fields.  Hospitalist service has been asked to admit for further management  Review of Systems:  General: Reports generalized fatigue and weakness.reports subjective. Denies fever, chills, weight loss, night sweats.  HENT: Denies head trauma, headache, denies change in hearing, tinnitus.  Denies nasal congestion or bleeding.  Denies sores in mouth.  Denies difficulty swallowing Eyes: Denies blurry vision, pain in eye, drainage.  Denies discoloration of eyes. Neck: Denies pain.  Denies swelling.  Denies pain with movement. Cardiovascular: Denies chest pain, palpitations.  Denies edema.  Denies orthopnea Respiratory: Reports shortness of breath, cough.   Denies wheezing.  Denies sputum production Gastrointestinal: Denies abdominal pain, swelling.  Denies nausea, vomiting, diarrhea.  Denies melena.  Denies hematemesis. Musculoskeletal: Denies limitation of movement.  Denies deformity or swelling. Genitourinary: Denies pelvic pain.  Denies urinary frequency or hesitancy.  Denies dysuria.  Skin: Denies rash.  Denies petechiae, purpura, ecchymosis. Neurological:  Denies syncope.  Denies seizure activity.  Denies paresthesia.  Denies slurred speech, drooping face.  Denies visual change. Psychiatric: Denies depression, anxiety. Denies hallucinations.  Past Medical History:  Diagnosis Date  . Fibroids   . Medical history non-contributory     Past Surgical History:  Procedure Laterality Date  . FRACTURE SURGERY Right    hip  . MYOMECTOMY N/A 02/04/2016   Procedure: MYOMECTOMY;  Surgeon: Eldred Manges, MD;  Location: [facility];  Service: Gynecology;  Laterality: N/A;    Social History  reports that she has never smoked. She has never used smokeless tobacco. She reports that she does not drink alcohol and does not use drugs.  No Known Allergies  Family History  Problem Relation Age of Onset  . Breast cancer Maternal Aunt      Prior to Admission medications   Medication Sig Start Date End Date Taking? Authorizing Provider  acetaminophen (TYLENOL) 500 MG tablet Take 1,000 mg by mouth every 6 (six) hours as needed.   Yes [provider]  losartan (COZAAR) 50 MG tablet Take 50 mg by mouth daily.   Yes [provider]  Multiple Vitamin (MULTIVITAMIN WITH MINERALS) TABS Take 1 tablet by mouth daily.  Yes [provider]  docusate sodium (COLACE) 50 MG capsule Take 1 capsule (50 mg total) by mouth 2 (two) times daily as needed for mild constipation. Patient not taking: No sig reported 02/05/16   Haygood, Seymour Bars, MD  HYDROcodone-acetaminophen (NORCO/VICODIN) 5-325 MG tablet Take 1-2 tablets by mouth every 4 (four)  hours as needed for moderate pain or severe pain. Patient not taking: No sig reported 02/05/16   Haygood, Seymour Bars, MD  ibuprofen (ADVIL,MOTRIN) 600 MG tablet Take 1 tablet (600 mg total) by mouth every 6 (six) hours. Patient not taking: No sig reported 02/05/16   Haygood, Seymour Bars, MD  ondansetron (ZOFRAN) 4 MG tablet Take 1 tablet (4 mg total) by mouth every 6 (six) hours as needed for nausea. Patient not taking: No sig reported 02/05/16   Haygood, Seymour Bars, MD  simethicone (MYLICON) 80 MG chewable tablet Chew 1 tablet (80 mg total) by mouth 4 (four) times daily as needed for flatulence. Patient not taking: No sig reported 02/05/16   Eldred Manges, MD    Physical Exam: Vitals:   09/24/20 2130 09/24/20 2145 09/24/20 2200 09/24/20 2315  BP: 116/74 118/72 115/73 111/70  Pulse: 73 72 76 73  Resp: (!) 28 (!) 32 (!) 29 (!) 29  Temp:      TempSrc:      SpO2: 98% 100% 98% 93%    Constitutional: NAD, calm, comfortable Vitals:   09/24/20 2130 09/24/20 2145 09/24/20 2200 09/24/20 2315  BP: 116/74 118/72 115/73 111/70  Pulse: 73 72 76 73  Resp: (!) 28 (!) 32 (!) 29 (!) 29  Temp:      TempSrc:      SpO2: 98% 100% 98% 93%   General: WDWN, Alert and oriented x3.  Eyes: EOMI, PERRL, conjunctivae normal.  Sclera nonicteric HENT:  [place]/AT, external ears normal.  Nares patent without epistasis.  Mucous membranes are moist. Neck: Soft, normal range of motion, supple, no masses, no thyromegaly. Trachea midline Respiratory: Equal breath sounds.  Diffuse scattered rales.  no wheezing, no crackles. Normal respiratory effort. No accessory muscle use.  Cardiovascular: Regular rate and rhythm, no murmurs / rubs / gallops. No extremity edema.  Abdomen: Soft, no tenderness, nondistended, no rebound or guarding.  No masses palpated. Bowel sounds normoactive Musculoskeletal: FROM. no cyanosis.Normal muscle tone.  Skin: Warm, dry, intact no rashes, lesions, ulcers. No induration Neurologic: Normal  speech.  Sensation intact, Strength 5/5 in all extremities.   Psychiatric: Normal judgment and insight.  Normal mood.    Labs on Admission: I have personally reviewed following labs and imaging studies  CBC: Recent Labs  Lab 09/24/20 1939  WBC 4.6  NEUTROABS 3.7  HGB 8.2*  HCT 29.8*  MCV 71.5*  PLT 664    Basic Metabolic Panel: Recent Labs  Lab 09/24/20 2040  NA 139  K 4.2  CL 104  CO2 26  GLUCOSE 122*  BUN 15  CREATININE 0.88  CALCIUM 8.2*    GFR: CrCl cannot be calculated (Unknown ideal weight.).  Liver Function Tests: Recent Labs  Lab 09/24/20 2040  AST 36  ALT 17  ALKPHOS 57  BILITOT 0.3  PROT 7.3  ALBUMIN 3.3*    Urine analysis: No results found for: COLORURINE, APPEARANCEUR, LABSPEC, PHURINE, GLUCOSEU, HGBUR, BILIRUBINUR, KETONESUR, PROTEINUR, UROBILINOGEN, NITRITE, LEUKOCYTESUR  Radiological Exams on Admission: DG Chest Port 1 View  Result Date: 09/24/2020 CLINICAL DATA:  COVID symptoms. Possible COVID exposure. Shortness of breath. EXAM: PORTABLE CHEST 1 VIEW COMPARISON:  None. FINDINGS: Lung volumes are low. Upper normal heart size is likely related to low lung volumes. Rather extensive heterogeneous bilateral lung opacities, right greater than left. Trace fluid in the right minor fissure. No pneumothorax or large pleural effusion. No acute osseous abnormalities are seen. IMPRESSION: 1. Rather extensive bilateral heterogeneous lung opacities, right greater than left, consistent with COVID pneumonia. 2. Trace fluid in the right minor fissure. 3. Low lung volumes. Electronically Signed   By: Keith Rake M.D.   On: 09/24/2020 20:04    Assessment/Plan Principal Problem:   Pneumonia due to COVID-19 virus Sarted on remdesivir Supplemental oxygen provided keep O2 sat between 92 to 96% Albuterol MDI every 4 hours as needed for wheezing cough, shortness of breath Antitussives provided Incentive spirometer every 1-2 hours while awake Discussed with  patient possibility of needing to add Actemra or baricitinib to his regimen if respiratory status deteriorates.  Discussed pros and cons of therapy.  Patient has no history of TB, immunosuppression, cancer, diverticulitis, hepatitis.  She is in agreement to have this therapy if it is required  Active Problems:   Hypoxia Supplemental oxygen as above.  Albuterol MDI as above. Patient has elevated D-dimer level will obtain CT angiography of chest to rule out PE since Covid is a prothrombotic condition.  If PE is identified patient will be placed on therapeutic anticoagulation    Hypochromic microcytic anemia With chronic microcytic hypochromic anemia when reviewing her chart.  Check iron and TIBC level.  If iron is low we will start iron supplementation    Essential hypertension Continue losartan.  Monitor blood pressure    DVT prophylaxis: Lovenox for DVT prophylaxis Code Status:   Full code Family Communication:  Diagnosis and plan discussed with patient she agrees and.  Questions answered.  Further recommendations to follow as clinically indicated Disposition Plan:   Patient is from:  Home  Anticipated DC to:  Home  Anticipated DC date:  Anticipate more than 2 midnight stay in hospital to treat acute condition  Anticipated DC barriers: No barriers to discharge identified at this time  Admission status:  Inpatient   Yevonne Aline Lacee Grey MD [facility]  How to contact the [facility] Attending or Consulting provider [place] or covering provider during after hours [place], for this patient?   1. Check the care team in [facility] and look for a) attending/consulting [facility] provider listed and b) the [facility] team listed 2. Log into [email or web address] and use [place]'s universal password to access. If you do not have the password, please contact the hospital operator. 3. Locate the [facility] provider you are looking for under [facility] and page to a number that you can be directly reached. 4. If you  still have difficulty reaching the provider, please page the [facility] (Director on Call) for the Hospitalists listed on amion for assistance.  09/24/2020, 11:55 PM

## 2020-09-25 ENCOUNTER — Inpatient Hospital Stay (HOSPITAL_COMMUNITY): Payer: HRSA Program

## 2020-09-25 DIAGNOSIS — I10 Essential (primary) hypertension: Secondary | ICD-10-CM

## 2020-09-25 DIAGNOSIS — J9601 Acute respiratory failure with hypoxia: Secondary | ICD-10-CM

## 2020-09-25 DIAGNOSIS — D509 Iron deficiency anemia, unspecified: Secondary | ICD-10-CM

## 2020-09-25 LAB — COMPREHENSIVE METABOLIC PANEL
ALT: 18 U/L (ref 0–44)
AST: 35 U/L (ref 15–41)
Albumin: 3.1 g/dL — ABNORMAL LOW (ref 3.5–5.0)
Alkaline Phosphatase: 53 U/L (ref 38–126)
Anion gap: 8 (ref 5–15)
BUN: 15 mg/dL (ref 6–20)
CO2: 26 mmol/L (ref 22–32)
Calcium: 8 mg/dL — ABNORMAL LOW (ref 8.9–10.3)
Chloride: 105 mmol/L (ref 98–111)
Creatinine, Ser: 0.91 mg/dL (ref 0.44–1.00)
GFR, Estimated: >60 mL/min (ref >60)
Glucose, Bld: 160 mg/dL — ABNORMAL HIGH (ref 70–99)
Potassium: 4.2 mmol/L (ref 3.5–5.1)
Sodium: 139 mmol/L (ref 135–145)
Total Bilirubin: 0.6 mg/dL (ref 0.3–1.2)
Total Protein: 6.9 g/dL (ref 6.5–8.1)

## 2020-09-25 LAB — CBC WITH DIFFERENTIAL/PLATELET
Abs Immature Granulocytes: 0.07 10*3/uL (ref 0.00–0.07)
Basophils Absolute: 0 10*3/uL (ref 0.0–0.1)
Basophils Relative: 0 %
Eosinophils Absolute: 0 10*3/uL (ref 0.0–0.5)
Eosinophils Relative: 0 %
HCT: 29.8 % — ABNORMAL LOW (ref 36.0–46.0)
Hemoglobin: 8 g/dL — ABNORMAL LOW (ref 12.0–15.0)
Immature Granulocytes: 2 %
Lymphocytes Relative: 13 %
Lymphs Abs: 0.5 10*3/uL — ABNORMAL LOW (ref 0.7–4.0)
MCH: 19.1 pg — ABNORMAL LOW (ref 26.0–34.0)
MCHC: 26.8 g/dL — ABNORMAL LOW (ref 30.0–36.0)
MCV: 71.1 fL — ABNORMAL LOW (ref 80.0–100.0)
Monocytes Absolute: 0.1 10*3/uL (ref 0.1–1.0)
Monocytes Relative: 3 %
Neutro Abs: 3 10*3/uL (ref 1.7–7.7)
Neutrophils Relative %: 82 %
Platelets: 274 10*3/uL (ref 150–400)
RBC: 4.19 MIL/uL (ref 3.87–5.11)
RDW: 19.5 % — ABNORMAL HIGH (ref 11.5–15.5)
WBC: 3.7 10*3/uL — ABNORMAL LOW (ref 4.0–10.5)
nRBC: 1.6 % — ABNORMAL HIGH (ref 0.0–0.2)

## 2020-09-25 LAB — IRON AND TIBC
Iron: 22 ug/dL — ABNORMAL LOW (ref 28–170)
Saturation Ratios: 6 % — ABNORMAL LOW (ref 10.4–31.8)
TIBC: 366 ug/dL (ref 250–450)
UIBC: 344 ug/dL

## 2020-09-25 LAB — C-REACTIVE PROTEIN: CRP: 11.4 mg/dL — ABNORMAL HIGH (ref <1.0)

## 2020-09-25 LAB — FERRITIN: Ferritin: 47 ng/mL (ref 11–307)

## 2020-09-25 MED ORDER — AEROCHAMBER Z-STAT PLUS/MEDIUM MISC
Status: AC
  Administered 2020-09-25: 1
  Filled 2020-09-25: qty 1

## 2020-09-25 MED ORDER — IOHEXOL 350 MG/ML SOLN
100.0000 mL | Freq: Once | INTRAVENOUS | Status: AC | PRN
  Administered 2020-09-25: 100 mL via INTRAVENOUS

## 2020-09-25 MED ORDER — BARICITINIB 2 MG PO TABS
4.0000 mg | ORAL_TABLET | Freq: Every day | ORAL | Status: DC
  Administered 2020-09-25 – 2020-10-01 (×7): 4 mg via ORAL
  Filled 2020-09-25 (×7): qty 2

## 2020-09-25 MED ORDER — ALBUTEROL SULFATE HFA 108 (90 BASE) MCG/ACT IN AERS
2.0000 | INHALATION_SPRAY | RESPIRATORY_TRACT | Status: DC
  Administered 2020-09-25 (×5): 2 via RESPIRATORY_TRACT
  Filled 2020-09-25: qty 6.7

## 2020-09-25 MED ORDER — FUROSEMIDE 10 MG/ML IJ SOLN
40.0000 mg | Freq: Once | INTRAMUSCULAR | Status: AC
  Administered 2020-09-25: 40 mg via INTRAVENOUS
  Filled 2020-09-25: qty 4

## 2020-09-25 NOTE — ED Notes (Signed)
Pt I s stable, urine emptied and room cleared of clutter. Pt was given juice and ice

## 2020-09-25 NOTE — Progress Notes (Signed)
[facility]  PROGRESS NOTE  Barbara Randolph [id number] DOB: 1970/10/06 DOA: 09/24/2020 PCP: Donald Prose, MD   Brief HPI:   50 year old female with medical history of hypertension who came for evaluation of weakness and shortness of breath.  Patient stated that she drove to [place] with family over Christmas and was around family member who was diagnosed with COVID-19.  5 to 6 days ago patient developed generalized weakness and sore throat.  Also had muscle aches.  Denies nausea vomiting or diarrhea.  She is not vaccinated against COVID-19. In the ED SARS-CoV-2 RT-PCR was positive.  Also was found to have elevated D-dimer and CRP.  CTA chest was negative for pulmonary embolism.  Patient was requiring 4 L/min of oxygen. Patient started on remdesivir, Solu-Medrol.   Subjective   Patient seen and examined, denies shortness of breath.   Assessment/Plan:     1. Acute hypoxemic respiratory failure-secondary to COVID-19 pneumonia.  Patient started on remdesivir and Solu-Medrol.  Discussed with patient regarding baricitinib.  Patient has no history of TB, aspiration, cancer, hepatitis.  She is agreeable to starting baricitinib.  We will start baricitinib 4 mg daily.  CRP is down to 11.4.  Patient is currently requiring 4 L/min of oxygen via nasal cannula.  Follow inflammatory markers in AM. 2. Hypochromic microcytic anemia-hemoglobin is 8.0.  Iron level is 22, TIBC 366, 6% saturation.  She will need evaluation for iron deficiency anemia once more stable.  Consider discharging her home on iron supplementation.  Continue to monitor H&H in hospital.  Check FOBT. 3. Hypertension-continue losartan.  BP well controlled.      COVID-19 Labs  Recent Labs    09/24/20 2040 09/25/20 0356  DDIMER 0.81*  --   FERRITIN 45 47  LDH 389*  --   CRP 14.0* 11.4*    Lab Results  Component Value Date   SARSCOV2NAA POSITIVE (A) 09/24/2020     Scheduled medications:   . aerochamber Z-Stat  Plus/medium      . albuterol  2 puff Inhalation Q4H  . baricitinib  4 mg Oral Daily  . enoxaparin (LOVENOX) injection  40 mg Subcutaneous Q24H  . losartan  50 mg Oral Daily  . methylPREDNISolone (SOLU-MEDROL) injection  80 mg Intravenous Q12H   Followed by  . [START ON 09/28/2020] predniSONE  50 mg Oral Daily         CBG: No results for input(s): GLUCAP in the last 168 hours.  SpO2: 96 % O2 Flow Rate (L/min): 4 L/min    CBC: Recent Labs  Lab 09/24/20 1939 09/25/20 0356  WBC 4.6 3.7*  NEUTROABS 3.7 3.0  HGB 8.2* 8.0*  HCT 29.8* 29.8*  MCV 71.5* 71.1*  PLT 281 123456    Basic Metabolic Panel: Recent Labs  Lab 09/24/20 2040 09/25/20 0356  NA 139 139  K 4.2 4.2  CL 104 105  CO2 26 26  GLUCOSE 122* 160*  BUN 15 15  CREATININE 0.88 0.91  CALCIUM 8.2* 8.0*     Liver Function Tests: Recent Labs  Lab 09/24/20 2040 09/25/20 0356  AST 36 35  ALT 17 18  ALKPHOS 57 53  BILITOT 0.3 0.6  PROT 7.3 6.9  ALBUMIN 3.3* 3.1*     Antibiotics: Anti-infectives (From admission, onward)   Start     Dose/Rate Route Frequency Ordered Stop   09/25/20 1000  remdesivir 100 mg in sodium chloride 0.9 % 100 mL IVPB       "Followed by" Linked Group Details  100 mg 200 mL/hr over 30 Minutes Intravenous Daily 09/24/20 2320 09/29/20 0959   09/24/20 2345  remdesivir 200 mg in sodium chloride 0.9% 250 mL IVPB       "Followed by" Linked Group Details   200 mg 580 mL/hr over 30 Minutes Intravenous Once 09/24/20 2320 09/25/20 0002       DVT prophylaxis: Lovenox  Code Status: Full code  Family Communication: No family at bedside   Consultants:    Procedures:      Objective   Vitals:   09/25/20 1400 09/25/20 1430 09/25/20 1500 09/25/20 1530  BP: 119/80 128/85 128/76 115/80  Pulse: 79 82 80 94  Resp: (!) 35 18 (!) 40 (!) 25  Temp:      TempSrc:      SpO2: 99% 92% 97% 96%  Weight:      Height:        Intake/Output Summary (Last 24 hours) at 09/25/2020  1558 Last data filed at 09/25/2020 0926 Gross per 24 hour  Intake 800 ml  Output --  Net 800 ml    No intake/output data recorded.  Filed Weights   09/25/20 0008  Weight: 81.6 kg    Physical Examination:   General-appears in no acute distress Heart-S1-S2, regular, no murmur auscultated Lungs-clear to auscultation bilaterally, no wheezing or crackles auscultated Abdomen-soft, nontender, no organomegaly Extremities-no edema in the lower extremities Neuro-alert, oriented x3, no focal deficit noted  Status is: Inpatient  Dispo: The patient is from: Home              Anticipated d/c is to: Home              Anticipated d/c date is: 09/29/2020              Patient currently not medically stable for discharge  Barrier to discharge-acute hypoxemic respiratory failure            Data Reviewed:   Recent Results (from the past 240 hour(s))  Resp Panel by RT-PCR (Flu A&B, Covid) Nasopharyngeal Swab     Status: Abnormal   Collection Time: 09/24/20  8:15 PM   Specimen: Nasopharyngeal Swab; Nasopharyngeal(NP) swabs in vial transport medium  Result Value Ref Range Status   SARS Coronavirus 2 by RT PCR POSITIVE (A) NEGATIVE Final    Comment: GOODWIN R. 01.10.21 @ 2307 BY MECIAL J. (NOTE) SARS-CoV-2 target nucleic acids are DETECTED.  The SARS-CoV-2 RNA is generally detectable in upper respiratory specimens during the acute phase of infection. Positive results are indicative of the presence of the identified virus, but do not rule out bacterial infection or co-infection with other pathogens not detected by the test. Clinical correlation with patient history and other diagnostic information is necessary to determine patient infection status. The expected result is Negative.  Fact Sheet for Patients: EntrepreneurPulse.com.au  Fact Sheet for Healthcare Providers: IncredibleEmployment.be  This test is not yet approved or cleared by the  [place] FDA and  has been authorized for detection and/or diagnosis of SARS-CoV-2 by FDA under an Emergency Use Authorization (EUA).  This EUA will remain in effect (meaning this test can be used) for the duration of  the COVID-19 dec laration under Section 564(b)(1) of the Act, 21 U.S.C. section 360bbb-3(b)(1), unless the authorization is terminated or revoked sooner.     Influenza A by PCR NEGATIVE NEGATIVE Final   Influenza B by PCR NEGATIVE NEGATIVE Final    Comment: (NOTE) The Xpert Xpress SARS-CoV-2/FLU/RSV plus assay is intended  as an aid in the diagnosis of influenza from Nasopharyngeal swab specimens and should not be used as a sole basis for treatment. Nasal washings and aspirates are unacceptable for Xpert Xpress SARS-CoV-2/FLU/RSV testing.  Fact Sheet for Patients: EntrepreneurPulse.com.au  Fact Sheet for Healthcare Providers: IncredibleEmployment.be  This test is not yet approved or cleared by the [place] FDA and has been authorized for detection and/or diagnosis of SARS-CoV-2 by FDA under an Emergency Use Authorization (EUA). This EUA will remain in effect (meaning this test can be used) for the duration of the COVID-19 declaration under Section 564(b)(1) of the Act, 21 U.S.C. section 360bbb-3(b)(1), unless the authorization is terminated or revoked.  Performed at [facility], [place] [address], [place], [place] [address]   Blood Culture (routine x 2)     Status: None (Preliminary result)   Collection Time: 09/24/20  8:40 PM   Specimen: BLOOD  Result Value Ref Range Status   Specimen Description   Final    BLOOD LEFT ANTECUBITAL Performed at [facility] [address], [place], [place] [address]    Special Requests   Final    BOTTLES DRAWN AEROBIC AND ANAEROBIC Blood Culture adequate volume Performed at [facility] [address] [place], [place]  [address]    Culture   Final    NO GROWTH < 12 HOURS Performed at [facility] [address], [place], [place] [address]    Report Status PENDING  Incomplete  Blood Culture (routine x 2)     Status: None (Preliminary result)   Collection Time: 09/24/20  9:10 PM   Specimen: BLOOD  Result Value Ref Range Status   Specimen Description   Final    BLOOD SITE NOT SPECIFIED Performed at [facility] [address], [place], [place] [address]    Special Requests   Final    BOTTLES DRAWN AEROBIC AND ANAEROBIC Blood Culture adequate volume Performed at [facility] [address], [place], [place] [address]    Culture   Final    NO GROWTH < 12 HOURS Performed at [facility] [address], [place], [place] [address]    Report Status PENDING  Incomplete    No results for input(s): LIPASE, AMYLASE in the last 168 hours. No results for input(s): AMMONIA in the last 168 hours.  Cardiac Enzymes: No results for input(s): CKTOTAL, CKMB, CKMBINDEX, TROPONINI in the last 168 hours. BNP (last 3 results) No results for input(s): BNP in the last 8760 hours.  ProBNP (last 3 results) No results for input(s): PROBNP in the last 8760 hours.  Studies:  CT ANGIO CHEST PE W OR WO CONTRAST  Result Date: 09/25/2020 CLINICAL DATA:  Hypoxia, elevated D-dimer, shortness of breath. COVID. EXAM: CT ANGIOGRAPHY CHEST WITH CONTRAST TECHNIQUE: Multidetector CT imaging of the chest was performed using the standard protocol during bolus administration of intravenous contrast. Multiplanar CT image reconstructions and MIPs were obtained to evaluate the vascular anatomy. CONTRAST:  169mL OMNIPAQUE IOHEXOL 350 MG/ML SOLN COMPARISON:  Chest x-ray 09/24/2020 FINDINGS: Cardiovascular: No filling defects in the pulmonary arteries to suggest pulmonary emboli. Heart is normal size. Aorta is normal caliber. Mediastinum/Nodes: No mediastinal, hilar, or axillary adenopathy. Trachea and  esophagus are unremarkable. Thyroid unremarkable. Lungs/Pleura: Extensive bilateral ground-glass airspace opacities compatible with COVID pneumonia. No pneumothorax or effusions. Upper Abdomen: Imaging into the upper abdomen demonstrates no acute findings. Musculoskeletal: Chest wall soft tissues are unremarkable. No  acute bony abnormality. Review of the MIP images confirms the above findings. IMPRESSION: No evidence of pulmonary embolus. Extensive bilateral ground-glass airspace opacities compatible with COVID pneumonia. Electronically Signed   By: Rolm Baptise M.D.   On: 09/25/2020 03:34   DG Chest Port 1 View  Result Date: 09/24/2020 CLINICAL DATA:  COVID symptoms. Possible COVID exposure. Shortness of breath. EXAM: PORTABLE CHEST 1 VIEW COMPARISON:  None. FINDINGS: Lung volumes are low. Upper normal heart size is likely related to low lung volumes. Rather extensive heterogeneous bilateral lung opacities, right greater than left. Trace fluid in the right minor fissure. No pneumothorax or large pleural effusion. No acute osseous abnormalities are seen. IMPRESSION: 1. Rather extensive bilateral heterogeneous lung opacities, right greater than left, consistent with COVID pneumonia. 2. Trace fluid in the right minor fissure. 3. Low lung volumes. Electronically Signed   By: Keith Rake M.D.   On: 09/24/2020 20:04       Oswald Hillock   [facility] If 7PM-7AM, please contact night-coverage at [email or web address], Office  [phone]   09/25/2020, 3:58 PM  LOS: 1 day

## 2020-09-26 LAB — COMPREHENSIVE METABOLIC PANEL
ALT: 21 U/L (ref 0–44)
AST: 36 U/L (ref 15–41)
Albumin: 3.2 g/dL — ABNORMAL LOW (ref 3.5–5.0)
Alkaline Phosphatase: 55 U/L (ref 38–126)
Anion gap: 10 (ref 5–15)
BUN: 17 mg/dL (ref 6–20)
CO2: 28 mmol/L (ref 22–32)
Calcium: 8.6 mg/dL — ABNORMAL LOW (ref 8.9–10.3)
Chloride: 104 mmol/L (ref 98–111)
Creatinine, Ser: 0.82 mg/dL (ref 0.44–1.00)
GFR, Estimated: >60 mL/min (ref >60)
Glucose, Bld: 185 mg/dL — ABNORMAL HIGH (ref 70–99)
Potassium: 3.7 mmol/L (ref 3.5–5.1)
Sodium: 142 mmol/L (ref 135–145)
Total Bilirubin: 0.6 mg/dL (ref 0.3–1.2)
Total Protein: 6.9 g/dL (ref 6.5–8.1)

## 2020-09-26 LAB — CBC WITH DIFFERENTIAL/PLATELET
Abs Immature Granulocytes: 0.03 10*3/uL (ref 0.00–0.07)
Basophils Absolute: 0 10*3/uL (ref 0.0–0.1)
Basophils Relative: 0 %
Eosinophils Absolute: 0 10*3/uL (ref 0.0–0.5)
Eosinophils Relative: 0 %
HCT: 28.8 % — ABNORMAL LOW (ref 36.0–46.0)
Hemoglobin: 8 g/dL — ABNORMAL LOW (ref 12.0–15.0)
Immature Granulocytes: 1 %
Lymphocytes Relative: 26 %
Lymphs Abs: 1 10*3/uL (ref 0.7–4.0)
MCH: 19.5 pg — ABNORMAL LOW (ref 26.0–34.0)
MCHC: 27.8 g/dL — ABNORMAL LOW (ref 30.0–36.0)
MCV: 70.2 fL — ABNORMAL LOW (ref 80.0–100.0)
Monocytes Absolute: 0.2 10*3/uL (ref 0.1–1.0)
Monocytes Relative: 4 %
Neutro Abs: 2.8 10*3/uL (ref 1.7–7.7)
Neutrophils Relative %: 69 %
Platelets: 327 10*3/uL (ref 150–400)
RBC: 4.1 MIL/uL (ref 3.87–5.11)
RDW: 19.6 % — ABNORMAL HIGH (ref 11.5–15.5)
WBC: 4 10*3/uL (ref 4.0–10.5)
nRBC: 1.5 % — ABNORMAL HIGH (ref 0.0–0.2)

## 2020-09-26 LAB — C-REACTIVE PROTEIN: CRP: 5.3 mg/dL — ABNORMAL HIGH (ref <1.0)

## 2020-09-26 LAB — FERRITIN: Ferritin: 45 ng/mL (ref 11–307)

## 2020-09-26 MED ORDER — FERROUS SULFATE 325 (65 FE) MG PO TABS
325.0000 mg | ORAL_TABLET | Freq: Every day | ORAL | Status: DC
  Administered 2020-09-27 – 2020-10-01 (×5): 325 mg via ORAL
  Filled 2020-09-26 (×5): qty 1

## 2020-09-26 MED ORDER — LIP MEDEX EX OINT
TOPICAL_OINTMENT | Freq: Once | CUTANEOUS | Status: AC
  Filled 2020-09-26: qty 7

## 2020-09-26 MED ORDER — ALBUTEROL SULFATE HFA 108 (90 BASE) MCG/ACT IN AERS
2.0000 | INHALATION_SPRAY | Freq: Three times a day (TID) | RESPIRATORY_TRACT | Status: DC
Start: 2020-09-27 — End: 2020-10-01
  Administered 2020-09-27 – 2020-10-01 (×13): 2 via RESPIRATORY_TRACT
  Filled 2020-09-26: qty 6.7

## 2020-09-26 MED ORDER — ALBUTEROL SULFATE HFA 108 (90 BASE) MCG/ACT IN AERS: 2.0000 | INHALATION_SPRAY | RESPIRATORY_TRACT | Status: DC | PRN

## 2020-09-26 MED ORDER — ALBUTEROL SULFATE HFA 108 (90 BASE) MCG/ACT IN AERS
2.0000 | INHALATION_SPRAY | Freq: Four times a day (QID) | RESPIRATORY_TRACT | Status: DC
  Administered 2020-09-26 (×3): 2 via RESPIRATORY_TRACT

## 2020-09-26 MED ORDER — POLYETHYLENE GLYCOL 3350 17 G PO PACK
17.0000 g | PACK | Freq: Every day | ORAL | Status: DC
  Administered 2020-09-29 – 2020-09-30 (×2): 17 g via ORAL
  Filled 2020-09-26 (×3): qty 1

## 2020-09-26 MED ORDER — DOCUSATE SODIUM 100 MG PO CAPS
100.0000 mg | ORAL_CAPSULE | Freq: Two times a day (BID) | ORAL | Status: DC
  Administered 2020-09-26 – 2020-10-01 (×9): 100 mg via ORAL
  Filled 2020-09-26 (×10): qty 1

## 2020-09-26 NOTE — ED Notes (Signed)
Attempted to give report, nurse is busy.  Will try again in 10 mins.

## 2020-09-26 NOTE — ED Notes (Signed)
Patient given incentive spirometer/flutter valve and used teach back to use it.  Patient also moved from her back to her side with improvement in oxygen saturations.  Patient assisted to the [facility] this morning and had a BM.

## 2020-09-26 NOTE — Progress Notes (Signed)
[facility] PROGRESS NOTE    Progress Note  Barbara Randolph  [id number] DOB: [birth year] DOA: 09/24/2020 PCP: Donald Prose, MD     Brief Narrative:   Barbara Randolph is an 50 y.o. female past medical history of essential hypertension comes in for shortness of breath he has not been vaccinated SARS-CoV-2 was positive CTA was negative for pulmonary embolism but showed bilateral pulmonary infiltrates he had to be placed on 4 oxygen liters of oxygen  Assessment/Plan:   Acute respiratory failure with hypoxia secondarily to Pneumonia due to COVID-19 virus He is requiring 4 L of oxygen to keep saturations greater than 92%. Empirically on IV steroids, IV remdesivir and baricitinib. His inflammatory markers are improving. Continue strict I's and O's and daily weights, out of bed to chair try to keep prone for at least 16 hours a day.  Microcytic anemia: MCV is low RDW is high, ferritin will be unreliable as it is acting as an acute phase reactant. Menstruating female and fertile years, we will go ahead and start her on ferrous sulfate.  Essential hypertension; Blood pressure stable creatinine at baseline continue losartan.   DVT prophylaxis: lovenox Family Communication:none Status is: Inpatient  Remains inpatient appropriate because:Hemodynamically unstable   Dispo: The patient is from: Home              Anticipated d/c is to: Home              Anticipated d/c date is: > 3 days              Patient currently is not medically stable to d/c.  Code Status:     Code Status Orders  (From admission, onward)         Start     Ordered   09/24/20 2358  Full code  Continuous        09/24/20 2357        Code Status History    This patient has a current code status but no historical code status.   Advance Care Planning Activity        IV Access:    Peripheral IV   Procedures and diagnostic studies:   CT ANGIO CHEST PE W OR WO CONTRAST  Result Date:  09/25/2020 CLINICAL DATA:  Hypoxia, elevated D-dimer, shortness of breath. COVID. EXAM: CT ANGIOGRAPHY CHEST WITH CONTRAST TECHNIQUE: Multidetector CT imaging of the chest was performed using the standard protocol during bolus administration of intravenous contrast. Multiplanar CT image reconstructions and MIPs were obtained to evaluate the vascular anatomy. CONTRAST:  129mL OMNIPAQUE IOHEXOL 350 MG/ML SOLN COMPARISON:  Chest x-ray 09/24/2020 FINDINGS: Cardiovascular: No filling defects in the pulmonary arteries to suggest pulmonary emboli. Heart is normal size. Aorta is normal caliber. Mediastinum/Nodes: No mediastinal, hilar, or axillary adenopathy. Trachea and esophagus are unremarkable. Thyroid unremarkable. Lungs/Pleura: Extensive bilateral ground-glass airspace opacities compatible with COVID pneumonia. No pneumothorax or effusions. Upper Abdomen: Imaging into the upper abdomen demonstrates no acute findings. Musculoskeletal: Chest wall soft tissues are unremarkable. No acute bony abnormality. Review of the MIP images confirms the above findings. IMPRESSION: No evidence of pulmonary embolus. Extensive bilateral ground-glass airspace opacities compatible with COVID pneumonia. Electronically Signed   By: Rolm Baptise M.D.   On: 09/25/2020 03:34   DG Chest Port 1 View  Result Date: 09/24/2020 CLINICAL DATA:  COVID symptoms. Possible COVID exposure. Shortness of breath. EXAM: PORTABLE CHEST 1 VIEW COMPARISON:  None. FINDINGS: Lung volumes are low. Upper normal heart size is likely related  to low lung volumes. Rather extensive heterogeneous bilateral lung opacities, right greater than left. Trace fluid in the right minor fissure. No pneumothorax or large pleural effusion. No acute osseous abnormalities are seen. IMPRESSION: 1. Rather extensive bilateral heterogeneous lung opacities, right greater than left, consistent with COVID pneumonia. 2. Trace fluid in the right minor fissure. 3. Low lung volumes.  Electronically Signed   By: Keith Rake M.D.   On: 09/24/2020 20:04     Medical Consultants:    None.  Anti-Infectives:   Remdesivir  Subjective:  [name] she relates she is still short of breath and coughing does not feel well.  Objective:    Vitals:   09/26/20 0841 09/26/20 0900 09/26/20 0930 09/26/20 1000  BP:  121/74 118/72 114/70  Pulse:  89 85 80  Resp:  (!) 28 (!) 9 18  Temp: 98.1 F (36.7 C)     TempSrc: Oral     SpO2:  97% 96% 98%  Weight:      Height:       SpO2: 98 % O2 Flow Rate (L/min): 4 L/min  No intake or output data in the 24 hours ending 09/26/20 1056 Filed Weights   09/25/20 0008  Weight: 81.6 kg    Exam: General exam: In no acute distress. Respiratory system: Good air movement and diffuse crackles bilaterally. Cardiovascular system: S1 & S2 heard, RRR. No JVD. Gastrointestinal system: Abdomen is nondistended, soft and nontender.  Extremities: No pedal edema. Skin: No rashes, lesions or ulcers  Data Reviewed:    Labs: Basic Metabolic Panel: Recent Labs  Lab 09/24/20 2040 09/25/20 0356 09/26/20 0516  NA 139 139 142  K 4.2 4.2 3.7  CL 104 105 104  CO2 26 26 28   GLUCOSE 122* 160* 185*  BUN 15 15 17   CREATININE 0.88 0.91 0.82  CALCIUM 8.2* 8.0* 8.6*   GFR Estimated Creatinine Clearance: 84.9 mL/min (by C-G formula based on SCr of 0.82 mg/dL). Liver Function Tests: Recent Labs  Lab 09/24/20 2040 09/25/20 0356 09/26/20 0516  AST 36 35 36  ALT 17 18 21   ALKPHOS 57 53 55  BILITOT 0.3 0.6 0.6  PROT 7.3 6.9 6.9  ALBUMIN 3.3* 3.1* 3.2*   No results for input(s): LIPASE, AMYLASE in the last 168 hours. No results for input(s): AMMONIA in the last 168 hours. Coagulation profile No results for input(s): INR, PROTIME in the last 168 hours. COVID-19 Labs  Recent Labs    09/24/20 2040 09/25/20 0356 09/26/20 0516  DDIMER 0.81*  --   --   FERRITIN 45 47 45  LDH 389*  --   --   CRP 14.0* 11.4* 5.3*    Lab  Results  Component Value Date   SARSCOV2NAA POSITIVE (A) 09/24/2020    CBC: Recent Labs  Lab 09/24/20 1939 09/25/20 0356 09/26/20 0516  WBC 4.6 3.7* 4.0  NEUTROABS 3.7 3.0 2.8  HGB 8.2* 8.0* 8.0*  HCT 29.8* 29.8* 28.8*  MCV 71.5* 71.1* 70.2*  PLT 281 274 327   Cardiac Enzymes: No results for input(s): CKTOTAL, CKMB, CKMBINDEX, TROPONINI in the last 168 hours. BNP (last 3 results) No results for input(s): PROBNP in the last 8760 hours. CBG: No results for input(s): GLUCAP in the last 168 hours. D-Dimer: Recent Labs    09/24/20 2040  DDIMER 0.81*   Hgb A1c: No results for input(s): HGBA1C in the last 72 hours. Lipid Profile: Recent Labs    09/24/20 2040  TRIG 92   Thyroid  function studies: No results for input(s): TSH, T4TOTAL, T3FREE, THYROIDAB in the last 72 hours.  Invalid input(s): FREET3 Anemia work up: Recent Labs    09/24/20 2215 09/25/20 0356 09/26/20 0516  FERRITIN  --  47 45  TIBC 366  --   --   IRON 22*  --   --    Sepsis Labs: Recent Labs  Lab 09/24/20 1939 09/24/20 2040 09/25/20 0356 09/26/20 0516  PROCALCITON  --  <0.10  --   --   WBC 4.6  --  3.7* 4.0  LATICACIDVEN  --  1.1  --   --    Microbiology Recent Results (from the past 240 hour(s))  Resp Panel by RT-PCR (Flu A&B, Covid) Nasopharyngeal Swab     Status: Abnormal   Collection Time: 09/24/20  8:15 PM   Specimen: Nasopharyngeal Swab; Nasopharyngeal(NP) swabs in vial transport medium  Result Value Ref Range Status   SARS Coronavirus 2 by RT PCR POSITIVE (A) NEGATIVE Final    Comment: GOODWIN R. 01.10.21 @ 2307 BY MECIAL J. (NOTE) SARS-CoV-2 target nucleic acids are DETECTED.  The SARS-CoV-2 RNA is generally detectable in upper respiratory specimens during the acute phase of infection. Positive results are indicative of the presence of the identified virus, but do not rule out bacterial infection or co-infection with other pathogens not detected by the test. Clinical  correlation with patient history and other diagnostic information is necessary to determine patient infection status. The expected result is Negative.  Fact Sheet for Patients: EntrepreneurPulse.com.au  Fact Sheet for Healthcare Providers: IncredibleEmployment.be  This test is not yet approved or cleared by the [place] FDA and  has been authorized for detection and/or diagnosis of SARS-CoV-2 by FDA under an Emergency Use Authorization (EUA).  This EUA will remain in effect (meaning this test can be used) for the duration of  the COVID-19 dec laration under Section 564(b)(1) of the Act, 21 U.S.C. section 360bbb-3(b)(1), unless the authorization is terminated or revoked sooner.     Influenza A by PCR NEGATIVE NEGATIVE Final   Influenza B by PCR NEGATIVE NEGATIVE Final    Comment: (NOTE) The Xpert Xpress SARS-CoV-2/FLU/RSV plus assay is intended as an aid in the diagnosis of influenza from Nasopharyngeal swab specimens and should not be used as a sole basis for treatment. Nasal washings and aspirates are unacceptable for Xpert Xpress SARS-CoV-2/FLU/RSV testing.  Fact Sheet for Patients: EntrepreneurPulse.com.au  Fact Sheet for Healthcare Providers: IncredibleEmployment.be  This test is not yet approved or cleared by the [place] FDA and has been authorized for detection and/or diagnosis of SARS-CoV-2 by FDA under an Emergency Use Authorization (EUA). This EUA will remain in effect (meaning this test can be used) for the duration of the COVID-19 declaration under Section 564(b)(1) of the Act, 21 U.S.C. section 360bbb-3(b)(1), unless the authorization is terminated or revoked.  Performed at [facility], [place] [address], [place], [place] [address]   Blood Culture (routine x 2)     Status: None (Preliminary result)   Collection Time: 09/24/20  8:40 PM   Specimen: BLOOD  Result  Value Ref Range Status   Specimen Description   Final    BLOOD LEFT ANTECUBITAL Performed at [facility] [address], [place], [place] [address]    Special Requests   Final    BOTTLES DRAWN AEROBIC AND ANAEROBIC Blood Culture adequate volume Performed at [facility] [address], [place], [place] [address]    Culture  Final    NO GROWTH 2 DAYS Performed at [facility], [place] [address], [place], [place] [address]    Report Status PENDING  Incomplete  Blood Culture (routine x 2)     Status: None (Preliminary result)   Collection Time: 09/24/20  9:10 PM   Specimen: BLOOD  Result Value Ref Range Status   Specimen Description   Final    BLOOD SITE NOT SPECIFIED Performed at [facility] [address] [place], [place] [address]    Special Requests   Final    BOTTLES DRAWN AEROBIC AND ANAEROBIC Blood Culture adequate volume Performed at [facility] [address] [place], [place] [address]    Culture   Final    NO GROWTH 2 DAYS Performed at [facility] [address] [place], [place] [address]    Report Status PENDING  Incomplete     Medications:   . albuterol  2 puff Inhalation Q6H  . baricitinib  4 mg Oral Daily  . docusate sodium  100 mg Oral BID  . enoxaparin (LOVENOX) injection  40 mg Subcutaneous Q24H  . losartan  50 mg Oral Daily  . methylPREDNISolone (SOLU-MEDROL) injection  80 mg Intravenous Q12H   Followed by  . [START ON 09/28/2020] predniSONE  50 mg Oral Daily   Continuous Infusions: . remdesivir 100 mg in NS 100 mL 100 mg (09/26/20 1047)      LOS: 2 days   Charlynne Cousins  [facility]  09/26/2020, 10:56 AM

## 2020-09-27 LAB — CBC WITH DIFFERENTIAL/PLATELET
Abs Immature Granulocytes: 0.1 10*3/uL — ABNORMAL HIGH (ref 0.00–0.07)
Basophils Absolute: 0 10*3/uL (ref 0.0–0.1)
Basophils Relative: 0 %
Eosinophils Absolute: 0 10*3/uL (ref 0.0–0.5)
Eosinophils Relative: 0 %
HCT: 28.8 % — ABNORMAL LOW (ref 36.0–46.0)
Hemoglobin: 8.1 g/dL — ABNORMAL LOW (ref 12.0–15.0)
Immature Granulocytes: 1 %
Lymphocytes Relative: 15 %
Lymphs Abs: 1.1 10*3/uL (ref 0.7–4.0)
MCH: 19.7 pg — ABNORMAL LOW (ref 26.0–34.0)
MCHC: 28.1 g/dL — ABNORMAL LOW (ref 30.0–36.0)
MCV: 70.1 fL — ABNORMAL LOW (ref 80.0–100.0)
Monocytes Absolute: 0.4 10*3/uL (ref 0.1–1.0)
Monocytes Relative: 5 %
Neutro Abs: 5.7 10*3/uL (ref 1.7–7.7)
Neutrophils Relative %: 79 %
Platelets: 321 10*3/uL (ref 150–400)
RBC: 4.11 MIL/uL (ref 3.87–5.11)
RDW: 19.7 % — ABNORMAL HIGH (ref 11.5–15.5)
WBC: 7.3 10*3/uL (ref 4.0–10.5)
nRBC: 1.1 % — ABNORMAL HIGH (ref 0.0–0.2)

## 2020-09-27 LAB — COMPREHENSIVE METABOLIC PANEL
ALT: 22 U/L (ref 0–44)
AST: 33 U/L (ref 15–41)
Albumin: 3 g/dL — ABNORMAL LOW (ref 3.5–5.0)
Alkaline Phosphatase: 49 U/L (ref 38–126)
Anion gap: 9 (ref 5–15)
BUN: 14 mg/dL (ref 6–20)
CO2: 28 mmol/L (ref 22–32)
Calcium: 8.7 mg/dL — ABNORMAL LOW (ref 8.9–10.3)
Chloride: 105 mmol/L (ref 98–111)
Creatinine, Ser: 0.85 mg/dL (ref 0.44–1.00)
GFR, Estimated: >60 mL/min (ref >60)
Glucose, Bld: 186 mg/dL — ABNORMAL HIGH (ref 70–99)
Potassium: 3.9 mmol/L (ref 3.5–5.1)
Sodium: 142 mmol/L (ref 135–145)
Total Bilirubin: 0.6 mg/dL (ref 0.3–1.2)
Total Protein: 6.4 g/dL — ABNORMAL LOW (ref 6.5–8.1)

## 2020-09-27 LAB — C-REACTIVE PROTEIN: CRP: 1.5 mg/dL — ABNORMAL HIGH (ref <1.0)

## 2020-09-27 NOTE — Progress Notes (Signed)
[facility] PROGRESS NOTE    Progress Note  Barbara Randolph  [id number] DOB: 06/24/1971 DOA: 09/24/2020 PCP: Donald Prose, MD     Brief Narrative:   Barbara Randolph is an 50 y.o. female past medical history of essential hypertension comes in for shortness of breath he has not been vaccinated SARS-CoV-2 was positive CTA was negative for pulmonary embolism but showed bilateral pulmonary infiltrates he had to be placed on 4 oxygen liters of oxygen  Assessment/Plan:   Acute respiratory failure with hypoxia secondarily to Pneumonia due to COVID-19 virus She is still requiring 4 L of oxygen to keep saturations greater than 92%. Continue IV remdesivir steroids and baricitinib. Her inflammatory markers continue to improve try to wean her to room air. Try to keep the patient prone for at least 16 hours a day if not prone out of bed to chair, encourage incentive spirometry and flutter valve. She is on Lovenox for DVT prophylaxis.  Microcytic anemia: MCV is low RDW is high, ferritin will be unreliable as it is acting as an acute phase reactant. Menstruating female and fertile years, we will go ahead and start her on ferrous sulfate.  Essential hypertension; Blood pressure stable creatinine at baseline continue losartan.   DVT prophylaxis: lovenox Family Communication:none Status is: Inpatient  Remains inpatient appropriate because:Hemodynamically unstable   Dispo: The patient is from: Home              Anticipated d/c is to: Home              Anticipated d/c date is: > 3 days              Patient currently is not medically stable to d/c.  Code Status:     Code Status Orders  (From admission, onward)         Start     Ordered   09/24/20 2358  Full code  Continuous        09/24/20 2357        Code Status History    This patient has a current code status but no historical code status.   Advance Care Planning Activity        IV Access:    Peripheral  IV   Procedures and diagnostic studies:   No results found.   Medical Consultants:    None.  Anti-Infectives:   Remdesivir  Subjective:  Barbara Randolph relates her breathing is unchanged compared to yesterday.  Objective:    Vitals:   09/26/20 1613 09/26/20 2004 09/27/20 0005 09/27/20 0415  BP: 114/63 120/67 117/70 116/67  Pulse: 76 82 69 67  Resp: 20 17 18  (!) 21  Temp: 98.5 F (36.9 C) 97.6 F (36.4 C) 98.2 F (36.8 C) (!) 97.5 F (36.4 C)  TempSrc: Oral Oral  Oral  SpO2: 96% 100% 100% 95%  Weight:      Height:       SpO2: 95 % O2 Flow Rate (L/min): 4 L/min   Intake/Output Summary (Last 24 hours) at 09/27/2020 0934 Last data filed at 09/26/2020 1416 Gross per 24 hour  Intake 100 ml  Output --  Net 100 ml   Filed Weights   09/25/20 0008  Weight: 81.6 kg    Exam: General exam: In no acute distress. Respiratory system: Good air movement and clear to auscultation. Cardiovascular system: S1 & S2 heard, RRR. No JVD. Gastrointestinal system: Abdomen is nondistended, soft and nontender.  Extremities: No pedal edema. Skin: No rashes, lesions or  ulcers Psychiatry: Judgement and insight appear normal. Mood & affect appropriate.  Data Reviewed:    Labs: Basic Metabolic Panel: Recent Labs  Lab 09/24/20 2040 09/25/20 0356 09/26/20 0516 09/27/20 0414  NA 139 139 142 142  K 4.2 4.2 3.7 3.9  CL 104 105 104 105  CO2 26 26 28 28   GLUCOSE 122* 160* 185* 186*  BUN 15 15 17 14   CREATININE 0.88 0.91 0.82 0.85  CALCIUM 8.2* 8.0* 8.6* 8.7*   GFR Estimated Creatinine Clearance: 81.9 mL/min (by C-G formula based on SCr of 0.85 mg/dL). Liver Function Tests: Recent Labs  Lab 09/24/20 2040 09/25/20 0356 09/26/20 0516 09/27/20 0414  AST 36 35 36 33  ALT 17 18 21 22   ALKPHOS 57 53 55 49  BILITOT 0.3 0.6 0.6 0.6  PROT 7.3 6.9 6.9 6.4*  ALBUMIN 3.3* 3.1* 3.2* 3.0*   No results for input(s): LIPASE, AMYLASE in the last 168 hours. No results for  input(s): AMMONIA in the last 168 hours. Coagulation profile No results for input(s): INR, PROTIME in the last 168 hours. COVID-19 Labs  Recent Labs    09/24/20 2040 09/25/20 0356 09/26/20 0516 09/27/20 0414  DDIMER 0.81*  --   --   --   FERRITIN 45 47 45  --   LDH 389*  --   --   --   CRP 14.0* 11.4* 5.3* 1.5*    Lab Results  Component Value Date   SARSCOV2NAA POSITIVE (A) 09/24/2020    CBC: Recent Labs  Lab 09/24/20 1939 09/25/20 0356 09/26/20 0516 09/27/20 0414  WBC 4.6 3.7* 4.0 7.3  NEUTROABS 3.7 3.0 2.8 5.7  HGB 8.2* 8.0* 8.0* 8.1*  HCT 29.8* 29.8* 28.8* 28.8*  MCV 71.5* 71.1* 70.2* 70.1*  PLT 281 274 327 321   Cardiac Enzymes: No results for input(s): CKTOTAL, CKMB, CKMBINDEX, TROPONINI in the last 168 hours. BNP (last 3 results) No results for input(s): PROBNP in the last 8760 hours. CBG: No results for input(s): GLUCAP in the last 168 hours. D-Dimer: Recent Labs    09/24/20 2040  DDIMER 0.81*   Hgb A1c: No results for input(s): HGBA1C in the last 72 hours. Lipid Profile: Recent Labs    09/24/20 2040  TRIG 92   Thyroid function studies: No results for input(s): TSH, T4TOTAL, T3FREE, THYROIDAB in the last 72 hours.  Invalid input(s): FREET3 Anemia work up: Recent Labs    09/24/20 2215 09/25/20 0356 09/26/20 0516  FERRITIN  --  47 45  TIBC 366  --   --   IRON 22*  --   --    Sepsis Labs: Recent Labs  Lab 09/24/20 1939 09/24/20 2040 09/25/20 0356 09/26/20 0516 09/27/20 0414  PROCALCITON  --  <0.10  --   --   --   WBC 4.6  --  3.7* 4.0 7.3  LATICACIDVEN  --  1.1  --   --   --    Microbiology Recent Results (from the past 240 hour(s))  Resp Panel by RT-PCR (Flu A&B, Covid) Nasopharyngeal Swab     Status: Abnormal   Collection Time: 09/24/20  8:15 PM   Specimen: Nasopharyngeal Swab; Nasopharyngeal(NP) swabs in vial transport medium  Result Value Ref Range Status   SARS Coronavirus 2 by RT PCR POSITIVE (A) NEGATIVE Final     Comment: GOODWIN R. 01.10.21 @ 2307 BY MECIAL J. (NOTE) SARS-CoV-2 target nucleic acids are DETECTED.  The SARS-CoV-2 RNA is generally detectable in upper respiratory specimens during the  acute phase of infection. Positive results are indicative of the presence of the identified virus, but do not rule out bacterial infection or co-infection with other pathogens not detected by the test. Clinical correlation with patient history and other diagnostic information is necessary to determine patient infection status. The expected result is Negative.  Fact Sheet for Patients: EntrepreneurPulse.com.au  Fact Sheet for Healthcare Providers: IncredibleEmployment.be  This test is not yet approved or cleared by the [place] FDA and  has been authorized for detection and/or diagnosis of SARS-CoV-2 by FDA under an Emergency Use Authorization (EUA).  This EUA will remain in effect (meaning this test can be used) for the duration of  the COVID-19 dec laration under Section 564(b)(1) of the Act, 21 U.S.C. section 360bbb-3(b)(1), unless the authorization is terminated or revoked sooner.     Influenza A by PCR NEGATIVE NEGATIVE Final   Influenza B by PCR NEGATIVE NEGATIVE Final    Comment: (NOTE) The Xpert Xpress SARS-CoV-2/FLU/RSV plus assay is intended as an aid in the diagnosis of influenza from Nasopharyngeal swab specimens and should not be used as a sole basis for treatment. Nasal washings and aspirates are unacceptable for Xpert Xpress SARS-CoV-2/FLU/RSV testing.  Fact Sheet for Patients: EntrepreneurPulse.com.au  Fact Sheet for Healthcare Providers: IncredibleEmployment.be  This test is not yet approved or cleared by the [place] FDA and has been authorized for detection and/or diagnosis of SARS-CoV-2 by FDA under an Emergency Use Authorization (EUA). This EUA will remain in effect (meaning this test can  be used) for the duration of the COVID-19 declaration under Section 564(b)(1) of the Act, 21 U.S.C. section 360bbb-3(b)(1), unless the authorization is terminated or revoked.  Performed at [facility], [place] [address], [place], [place] [address]   Blood Culture (routine x 2)     Status: None (Preliminary result)   Collection Time: 09/24/20  8:40 PM   Specimen: BLOOD  Result Value Ref Range Status   Specimen Description   Final    BLOOD LEFT ANTECUBITAL Performed at [facility] [address], [place], [place] [address]    Special Requests   Final    BOTTLES DRAWN AEROBIC AND ANAEROBIC Blood Culture adequate volume Performed at [facility] [address], [place], [place] [address]    Culture   Final    NO GROWTH 3 DAYS Performed at [facility], [place] [address] [place], [place] [address]    Report Status PENDING  Incomplete  Blood Culture (routine x 2)     Status: None (Preliminary result)   Collection Time: 09/24/20  9:10 PM   Specimen: BLOOD  Result Value Ref Range Status   Specimen Description   Final    BLOOD SITE NOT SPECIFIED Performed at [facility] [address], [place], [place] [address]    Special Requests   Final    BOTTLES DRAWN AEROBIC AND ANAEROBIC Blood Culture adequate volume Performed at [facility] [address] [place], [place] [address]    Culture   Final    NO GROWTH 3 DAYS Performed at [facility], [place] [address], [place], [place] [address]    Report Status PENDING  Incomplete     Medications:   . albuterol  2 puff Inhalation TID  . baricitinib  4 mg Oral Daily  . docusate sodium  100 mg Oral BID  . enoxaparin (LOVENOX) injection  40 mg Subcutaneous Q24H  . ferrous  sulfate  325 mg Oral Q breakfast  . losartan  50 mg Oral Daily  . methylPREDNISolone (SOLU-MEDROL) injection  80 mg Intravenous Q12H   Followed by  . [START ON 09/28/2020]  predniSONE  50 mg Oral Daily  . polyethylene glycol  17 g Oral Daily   Continuous Infusions: . remdesivir 100 mg in NS 100 mL Stopped (09/26/20 1120)      LOS: 3 days   Charlynne Cousins  [facility]  09/27/2020, 9:34 AM

## 2020-09-28 LAB — C-REACTIVE PROTEIN: CRP: 1.2 mg/dL — ABNORMAL HIGH (ref <1.0)

## 2020-09-28 LAB — COMPREHENSIVE METABOLIC PANEL
ALT: 25 U/L (ref 0–44)
AST: 28 U/L (ref 15–41)
Albumin: 3.3 g/dL — ABNORMAL LOW (ref 3.5–5.0)
Alkaline Phosphatase: 50 U/L (ref 38–126)
Anion gap: 11 (ref 5–15)
BUN: 14 mg/dL (ref 6–20)
CO2: 26 mmol/L (ref 22–32)
Calcium: 8.7 mg/dL — ABNORMAL LOW (ref 8.9–10.3)
Chloride: 104 mmol/L (ref 98–111)
Creatinine, Ser: 0.78 mg/dL (ref 0.44–1.00)
GFR, Estimated: >60 mL/min (ref >60)
Glucose, Bld: 162 mg/dL — ABNORMAL HIGH (ref 70–99)
Potassium: 3.8 mmol/L (ref 3.5–5.1)
Sodium: 141 mmol/L (ref 135–145)
Total Bilirubin: 0.6 mg/dL (ref 0.3–1.2)
Total Protein: 6.3 g/dL — ABNORMAL LOW (ref 6.5–8.1)

## 2020-09-28 LAB — CBC WITH DIFFERENTIAL/PLATELET
Abs Immature Granulocytes: 0.43 10*3/uL — ABNORMAL HIGH (ref 0.00–0.07)
Basophils Absolute: 0 10*3/uL (ref 0.0–0.1)
Basophils Relative: 0 %
Eosinophils Absolute: 0 10*3/uL (ref 0.0–0.5)
Eosinophils Relative: 0 %
HCT: 30.6 % — ABNORMAL LOW (ref 36.0–46.0)
Hemoglobin: 8.2 g/dL — ABNORMAL LOW (ref 12.0–15.0)
Immature Granulocytes: 4 %
Lymphocytes Relative: 13 %
Lymphs Abs: 1.3 10*3/uL (ref 0.7–4.0)
MCH: 19.8 pg — ABNORMAL LOW (ref 26.0–34.0)
MCHC: 26.8 g/dL — ABNORMAL LOW (ref 30.0–36.0)
MCV: 73.7 fL — ABNORMAL LOW (ref 80.0–100.0)
Monocytes Absolute: 0.7 10*3/uL (ref 0.1–1.0)
Monocytes Relative: 8 %
Neutro Abs: 7.2 10*3/uL (ref 1.7–7.7)
Neutrophils Relative %: 75 %
Platelets: 313 10*3/uL (ref 150–400)
RBC: 4.15 MIL/uL (ref 3.87–5.11)
RDW: 20.2 % — ABNORMAL HIGH (ref 11.5–15.5)
WBC: 9.7 10*3/uL (ref 4.0–10.5)
nRBC: 1 % — ABNORMAL HIGH (ref 0.0–0.2)

## 2020-09-28 MED ORDER — FUROSEMIDE 10 MG/ML IJ SOLN
20.0000 mg | Freq: Two times a day (BID) | INTRAMUSCULAR | Status: AC
  Administered 2020-09-28 – 2020-09-29 (×2): 20 mg via INTRAVENOUS
  Filled 2020-09-28 (×2): qty 2

## 2020-09-28 NOTE — Progress Notes (Signed)
[facility] PROGRESS NOTE    Progress Note  Rollins Strzalka  [id number] DOB: 25-Dec-1970 DOA: 09/24/2020 PCP: Donald Prose, MD     Brief Narrative:   Barbara Randolph is an 50 y.o. female past medical history of essential hypertension comes in for shortness of breath he has not been vaccinated SARS-CoV-2 was positive CTA was negative for pulmonary embolism but showed bilateral pulmonary infiltrates he had to be placed on 4 oxygen liters of oxygen  Assessment/Plan:   Acute respiratory failure with hypoxia secondarily to Pneumonia due to COVID-19 virus Ms. Barbara Randolph is not requiring 3 L oxygen to keep saturations greater than 93%. She has completed her course of remdesivir, continue steroids and baricitinib. Inflammatory markers continue to improve nicely. Try to keep the patient prone for at least 16 hours a day if not prone out of bed to chair, encourage incentive spirometry and flutter valve. She is on Lovenox for DVT prophylaxis. Give single dose of IV Lasix recheck basic metabolic panel in the morning.  Microcytic anemia: MCV is low RDW is high, ferritin will be unreliable as it is acting as an acute phase reactant. Menstruating female and fertile years, we will go ahead and start her on ferrous sulfate.  Essential hypertension; Blood pressure stable creatinine at baseline continue losartan.   DVT prophylaxis: lovenox Family Communication:none Status is: Inpatient  Remains inpatient appropriate because:Hemodynamically unstable   Dispo: The patient is from: Home              Anticipated d/c is to: Home              Anticipated d/c date is: > 3 days              Patient currently is not medically stable to d/c.  Code Status:     Code Status Orders  (From admission, onward)         Start     Ordered   09/24/20 2358  Full code  Continuous        09/24/20 2357        Code Status History    This patient has a current code status but no historical code status.    Advance Care Planning Activity        IV Access:    Peripheral IV   Procedures and diagnostic studies:   No results found.   Medical Consultants:    None.  Anti-Infectives:   Remdesivir  Subjective:  [name] relates her breathing is unchanged compared to yesterday.  Objective:    Vitals:   09/27/20 1232 09/27/20 1500 09/27/20 2004 09/28/20 0447  BP: 121/81  122/73 125/69  Pulse: 61  80 (!) 58  Resp: 18  18 18   Temp: (!) 97.5 F (36.4 C)  97.7 F (36.5 C) 98 F (36.7 C)  TempSrc: Oral  Oral   SpO2: 99% 91% 93% 97%  Weight:      Height:       SpO2: 97 % O2 Flow Rate (L/min): 3 L/min   Intake/Output Summary (Last 24 hours) at 09/28/2020 1054 Last data filed at 09/28/2020 0900 Gross per 24 hour  Intake 354 ml  Output --  Net 354 ml   Filed Weights   09/25/20 0008  Weight: 81.6 kg    Exam: General exam: In no acute distress. Respiratory system: Good air movement and diffuse crackles bilaterally Cardiovascular system: S1 & S2 heard, RRR. No JVD. Gastrointestinal system: Abdomen is nondistended, soft and nontender.  Extremities:  No pedal edema. Skin: No rashes, lesions or ulcers Psychiatry: Judgement and insight appear normal. Mood & affect appropriate.  Data Reviewed:    Labs: Basic Metabolic Panel: Recent Labs  Lab 09/24/20 2040 09/25/20 0356 09/26/20 0516 09/27/20 0414 09/28/20 0355  NA 139 139 142 142 141  K 4.2 4.2 3.7 3.9 3.8  CL 104 105 104 105 104  CO2 26 26 28 28 26   GLUCOSE 122* 160* 185* 186* 162*  BUN 15 15 17 14 14   CREATININE 0.88 0.91 0.82 0.85 0.78  CALCIUM 8.2* 8.0* 8.6* 8.7* 8.7*   GFR Estimated Creatinine Clearance: 87 mL/min (by C-G formula based on SCr of 0.78 mg/dL). Liver Function Tests: Recent Labs  Lab 09/24/20 2040 09/25/20 0356 09/26/20 0516 09/27/20 0414 09/28/20 0355  AST 36 35 36 33 28  ALT 17 18 21 22 25   ALKPHOS 57 53 55 49 50  BILITOT 0.3 0.6 0.6 0.6 0.6  PROT 7.3 6.9 6.9 6.4* 6.3*   ALBUMIN 3.3* 3.1* 3.2* 3.0* 3.3*   No results for input(s): LIPASE, AMYLASE in the last 168 hours. No results for input(s): AMMONIA in the last 168 hours. Coagulation profile No results for input(s): INR, PROTIME in the last 168 hours. COVID-19 Labs  Recent Labs    09/26/20 0516 09/27/20 0414 09/28/20 0355  FERRITIN 45  --   --   CRP 5.3* 1.5* 1.2*    Lab Results  Component Value Date   SARSCOV2NAA POSITIVE (A) 09/24/2020    CBC: Recent Labs  Lab 09/24/20 1939 09/25/20 0356 09/26/20 0516 09/27/20 0414 09/28/20 0355  WBC 4.6 3.7* 4.0 7.3 9.7  NEUTROABS 3.7 3.0 2.8 5.7 7.2  HGB 8.2* 8.0* 8.0* 8.1* 8.2*  HCT 29.8* 29.8* 28.8* 28.8* 30.6*  MCV 71.5* 71.1* 70.2* 70.1* 73.7*  PLT 281 274 327 321 313   Cardiac Enzymes: No results for input(s): CKTOTAL, CKMB, CKMBINDEX, TROPONINI in the last 168 hours. BNP (last 3 results) No results for input(s): PROBNP in the last 8760 hours. CBG: No results for input(s): GLUCAP in the last 168 hours. D-Dimer: No results for input(s): DDIMER in the last 72 hours. Hgb A1c: No results for input(s): HGBA1C in the last 72 hours. Lipid Profile: No results for input(s): CHOL, HDL, LDLCALC, TRIG, CHOLHDL, LDLDIRECT in the last 72 hours. Thyroid function studies: No results for input(s): TSH, T4TOTAL, T3FREE, THYROIDAB in the last 72 hours.  Invalid input(s): FREET3 Anemia work up: Recent Labs    09/26/20 0516  FERRITIN 45   Sepsis Labs: Recent Labs  Lab 09/24/20 2040 09/25/20 0356 09/26/20 0516 09/27/20 0414 09/28/20 0355  PROCALCITON <0.10  --   --   --   --   WBC  --  3.7* 4.0 7.3 9.7  LATICACIDVEN 1.1  --   --   --   --    Microbiology Recent Results (from the past 240 hour(s))  Resp Panel by RT-PCR (Flu A&B, Covid) Nasopharyngeal Swab     Status: Abnormal   Collection Time: 09/24/20  8:15 PM   Specimen: Nasopharyngeal Swab; Nasopharyngeal(NP) swabs in vial transport medium  Result Value Ref Range Status   SARS  Coronavirus 2 by RT PCR POSITIVE (A) NEGATIVE Final    Comment: GOODWIN R. 01.10.21 @ 2307 BY MECIAL J. (NOTE) SARS-CoV-2 target nucleic acids are DETECTED.  The SARS-CoV-2 RNA is generally detectable in upper respiratory specimens during the acute phase of infection. Positive results are indicative of the presence of the identified virus, but do  not rule out bacterial infection or co-infection with other pathogens not detected by the test. Clinical correlation with patient history and other diagnostic information is necessary to determine patient infection status. The expected result is Negative.  Fact Sheet for Patients: EntrepreneurPulse.com.au  Fact Sheet for Healthcare Providers: IncredibleEmployment.be  This test is not yet approved or cleared by the [place] FDA and  has been authorized for detection and/or diagnosis of SARS-CoV-2 by FDA under an Emergency Use Authorization (EUA).  This EUA will remain in effect (meaning this test can be used) for the duration of  the COVID-19 dec laration under Section 564(b)(1) of the Act, 21 U.S.C. section 360bbb-3(b)(1), unless the authorization is terminated or revoked sooner.     Influenza A by PCR NEGATIVE NEGATIVE Final   Influenza B by PCR NEGATIVE NEGATIVE Final    Comment: (NOTE) The Xpert Xpress SARS-CoV-2/FLU/RSV plus assay is intended as an aid in the diagnosis of influenza from Nasopharyngeal swab specimens and should not be used as a sole basis for treatment. Nasal washings and aspirates are unacceptable for Xpert Xpress SARS-CoV-2/FLU/RSV testing.  Fact Sheet for Patients: EntrepreneurPulse.com.au  Fact Sheet for Healthcare Providers: IncredibleEmployment.be  This test is not yet approved or cleared by the [place] FDA and has been authorized for detection and/or diagnosis of SARS-CoV-2 by FDA under an Emergency Use Authorization (EUA).  This EUA will remain in effect (meaning this test can be used) for the duration of the COVID-19 declaration under Section 564(b)(1) of the Act, 21 U.S.C. section 360bbb-3(b)(1), unless the authorization is terminated or revoked.  Performed at [facility], [place] [address] [place], [place] [address]   Blood Culture (routine x 2)     Status: None (Preliminary result)   Collection Time: 09/24/20  8:40 PM   Specimen: BLOOD  Result Value Ref Range Status   Specimen Description   Final    BLOOD LEFT ANTECUBITAL Performed at [facility] [address] [place], [place] [address]    Special Requests   Final    BOTTLES DRAWN AEROBIC AND ANAEROBIC Blood Culture adequate volume Performed at [facility] [address] [place], [place] [address]    Culture   Final    NO GROWTH 4 DAYS Performed at [facility], [place] [address] [place], [place] [address]    Report Status PENDING  Incomplete  Blood Culture (routine x 2)     Status: None (Preliminary result)   Collection Time: 09/24/20  9:10 PM   Specimen: BLOOD  Result Value Ref Range Status   Specimen Description   Final    BLOOD SITE NOT SPECIFIED Performed at [facility] [address], [place], [place] [address]    Special Requests   Final    BOTTLES DRAWN AEROBIC AND ANAEROBIC Blood Culture adequate volume Performed at [facility] [address], [place], [place] [address]    Culture   Final    NO GROWTH 4 DAYS Performed at [facility], [place] [address], [place], [place] [address]    Report Status PENDING  Incomplete     Medications:   . albuterol  2 puff Inhalation TID  . baricitinib  4 mg Oral Daily  . docusate sodium  100 mg Oral BID  . enoxaparin (LOVENOX) injection  40 mg Subcutaneous Q24H  . ferrous sulfate  325 mg Oral Q breakfast  . losartan  50 mg Oral Daily  .  polyethylene glycol  17 g Oral Daily  . predniSONE   50 mg Oral Daily   Continuous Infusions:     LOS: 4 days   Charlynne Cousins  [facility]  09/28/2020, 10:54 AM

## 2020-09-29 LAB — CULTURE, BLOOD (ROUTINE X 2)
Culture: NO GROWTH
Culture: NO GROWTH
Special Requests: ADEQUATE
Special Requests: ADEQUATE

## 2020-09-29 LAB — COMPREHENSIVE METABOLIC PANEL
ALT: 23 U/L (ref 0–44)
AST: 20 U/L (ref 15–41)
Albumin: 2.9 g/dL — ABNORMAL LOW (ref 3.5–5.0)
Alkaline Phosphatase: 47 U/L (ref 38–126)
Anion gap: 10 (ref 5–15)
BUN: 14 mg/dL (ref 6–20)
CO2: 28 mmol/L (ref 22–32)
Calcium: 8.7 mg/dL — ABNORMAL LOW (ref 8.9–10.3)
Chloride: 103 mmol/L (ref 98–111)
Creatinine, Ser: 0.76 mg/dL (ref 0.44–1.00)
GFR, Estimated: >60 mL/min (ref >60)
Glucose, Bld: 119 mg/dL — ABNORMAL HIGH (ref 70–99)
Potassium: 3.7 mmol/L (ref 3.5–5.1)
Sodium: 141 mmol/L (ref 135–145)
Total Bilirubin: 0.6 mg/dL (ref 0.3–1.2)
Total Protein: 6 g/dL — ABNORMAL LOW (ref 6.5–8.1)

## 2020-09-29 LAB — C-REACTIVE PROTEIN: CRP: 1.1 mg/dL — ABNORMAL HIGH (ref <1.0)

## 2020-09-29 LAB — CBC WITH DIFFERENTIAL/PLATELET
Abs Immature Granulocytes: 0.37 10*3/uL — ABNORMAL HIGH (ref 0.00–0.07)
Basophils Absolute: 0 10*3/uL (ref 0.0–0.1)
Basophils Relative: 0 %
Eosinophils Absolute: 0 10*3/uL (ref 0.0–0.5)
Eosinophils Relative: 0 %
HCT: 30.4 % — ABNORMAL LOW (ref 36.0–46.0)
Hemoglobin: 8.3 g/dL — ABNORMAL LOW (ref 12.0–15.0)
Immature Granulocytes: 5 %
Lymphocytes Relative: 25 %
Lymphs Abs: 1.8 10*3/uL (ref 0.7–4.0)
MCH: 20 pg — ABNORMAL LOW (ref 26.0–34.0)
MCHC: 27.3 g/dL — ABNORMAL LOW (ref 30.0–36.0)
MCV: 73.1 fL — ABNORMAL LOW (ref 80.0–100.0)
Monocytes Absolute: 0.4 10*3/uL (ref 0.1–1.0)
Monocytes Relative: 6 %
Neutro Abs: 4.4 10*3/uL (ref 1.7–7.7)
Neutrophils Relative %: 64 %
Platelets: 283 10*3/uL (ref 150–400)
RBC: 4.16 MIL/uL (ref 3.87–5.11)
RDW: 20.6 % — ABNORMAL HIGH (ref 11.5–15.5)
WBC: 6.9 10*3/uL (ref 4.0–10.5)
nRBC: 2.3 % — ABNORMAL HIGH (ref 0.0–0.2)

## 2020-09-29 NOTE — Progress Notes (Signed)
[facility] PROGRESS NOTE    Progress Note  Barbara Randolph  [id number] DOB: 06/29/1971 DOA: 09/24/2020 PCP: Donald Prose, MD     Brief Narrative:   Barbara Randolph is an 50 y.o. female past medical history of essential hypertension comes in for shortness of breath he has not been vaccinated SARS-CoV-2 was positive CTA was negative for pulmonary embolism but showed bilateral pulmonary infiltrates he had to be placed on 4 oxygen liters of oxygen  Assessment/Plan:   Acute respiratory failure with hypoxia secondarily to Pneumonia due to COVID-19 virus [name] is down to 2 L of oxygen to keep saturations greater than 94%. She has completed course of IV remdesivir, continue steroids and baricitinib. Inflammatory markers continue to improve we will stop checking. 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 Ambulating and check saturations with ambulation try to wean down to room air.  Microcytic anemia: MCV is low RDW is high, ferritin will be unreliable as it is acting as an acute phase reactant. Menstruating female and fertile years, we will go ahead and start her on ferrous sulfate.  Essential hypertension; Blood pressure stable creatinine at baseline continue losartan.   DVT prophylaxis: lovenox Family Communication:none Status is: Inpatient  Remains inpatient appropriate because:Hemodynamically unstable   Dispo: The patient is from: Home              Anticipated d/c is to: Home              Anticipated d/c date is: > 3 days              Patient currently is not medically stable to d/c.  Code Status:     Code Status Orders  (From admission, onward)         Start     Ordered   09/24/20 2358  Full code  Continuous        09/24/20 2357        Code Status History    This patient has a current code status but no historical code status.   Advance Care Planning Activity         IV Access:    Peripheral IV   Procedures and diagnostic studies:   No results found.   Medical Consultants:    None.  Anti-Infectives:   Remdesivir  Subjective:  Barbara Randolph relates her breathing continues to improve.  Objective:    Vitals:   09/28/20 0447 09/28/20 1342 09/28/20 2107 09/29/20 0555  BP: 125/69 103/63 109/68 (!) 98/59  Pulse: (!) 58 66 66 61  Resp: 18 20 20 18   Temp: 98 F (36.7 C) (!) 97.4 F (36.3 C) 97.9 F (36.6 C) 97.8 F (36.6 C)  TempSrc:  Oral Oral Oral  SpO2: 97% 99% 96% 97%  Weight:      Height:       SpO2: 97 % O2 Flow Rate (L/min): 2 L/min   Intake/Output Summary (Last 24 hours) at 09/29/2020 0911 Last data filed at 09/29/2020 0855 Gross per 24 hour  Intake 592 ml  Output --  Net 592 ml   Filed Weights   09/25/20 0008  Weight: 81.6 kg    Exam: General exam: In no acute distress. Respiratory system: Good air movement and clear to auscultation. Cardiovascular system: S1 & S2 heard, RRR. No JVD. Gastrointestinal system: Abdomen is nondistended, soft and nontender.  Extremities: No pedal  edema. Skin: No rashes, lesions or ulcers Psychiatry: Judgement and insight appear normal. Mood & affect appropriate.  Data Reviewed:    Labs: Basic Metabolic Panel: Recent Labs  Lab 09/25/20 0356 09/26/20 0516 09/27/20 0414 09/28/20 0355 09/29/20 0357  NA 139 142 142 141 141  K 4.2 3.7 3.9 3.8 3.7  CL 105 104 105 104 103  CO2 26 28 28 26 28   GLUCOSE 160* 185* 186* 162* 119*  BUN 15 17 14 14 14   CREATININE 0.91 0.82 0.85 0.78 0.76  CALCIUM 8.0* 8.6* 8.7* 8.7* 8.7*   GFR Estimated Creatinine Clearance: 87 mL/min (by C-G formula based on SCr of 0.76 mg/dL). Liver Function Tests: Recent Labs  Lab 09/25/20 0356 09/26/20 0516 09/27/20 0414 09/28/20 0355 09/29/20 0357  AST 35 36 33 28 20  ALT 18 21 22 25 23   ALKPHOS 53 55 49 50 47  BILITOT 0.6 0.6 0.6 0.6 0.6  PROT 6.9 6.9 6.4* 6.3* 6.0*  ALBUMIN 3.1* 3.2*  3.0* 3.3* 2.9*   No results for input(s): LIPASE, AMYLASE in the last 168 hours. No results for input(s): AMMONIA in the last 168 hours. Coagulation profile No results for input(s): INR, PROTIME in the last 168 hours. COVID-19 Labs  Recent Labs    09/27/20 0414 09/28/20 0355 09/29/20 0357  CRP 1.5* 1.2* 1.1*    Lab Results  Component Value Date   SARSCOV2NAA POSITIVE (A) 09/24/2020    CBC: Recent Labs  Lab 09/25/20 0356 09/26/20 0516 09/27/20 0414 09/28/20 0355 09/29/20 0357  WBC 3.7* 4.0 7.3 9.7 6.9  NEUTROABS 3.0 2.8 5.7 7.2 4.4  HGB 8.0* 8.0* 8.1* 8.2* 8.3*  HCT 29.8* 28.8* 28.8* 30.6* 30.4*  MCV 71.1* 70.2* 70.1* 73.7* 73.1*  PLT 274 327 321 313 283   Cardiac Enzymes: No results for input(s): CKTOTAL, CKMB, CKMBINDEX, TROPONINI in the last 168 hours. BNP (last 3 results) No results for input(s): PROBNP in the last 8760 hours. CBG: No results for input(s): GLUCAP in the last 168 hours. D-Dimer: No results for input(s): DDIMER in the last 72 hours. Hgb A1c: No results for input(s): HGBA1C in the last 72 hours. Lipid Profile: No results for input(s): CHOL, HDL, LDLCALC, TRIG, CHOLHDL, LDLDIRECT in the last 72 hours. Thyroid function studies: No results for input(s): TSH, T4TOTAL, T3FREE, THYROIDAB in the last 72 hours.  Invalid input(s): FREET3 Anemia work up: No results for input(s): VITAMINB12, FOLATE, FERRITIN, TIBC, IRON, RETICCTPCT in the last 72 hours. Sepsis Labs: Recent Labs  Lab 09/24/20 2040 09/25/20 0356 09/26/20 0516 09/27/20 0414 09/28/20 0355 09/29/20 0357  PROCALCITON <0.10  --   --   --   --   --   WBC  --    < > 4.0 7.3 9.7 6.9  LATICACIDVEN 1.1  --   --   --   --   --    < > = values in this interval not displayed.   Microbiology Recent Results (from the past 240 hour(s))  Resp Panel by RT-PCR (Flu A&B, Covid) Nasopharyngeal Swab     Status: Abnormal   Collection Time: 09/24/20  8:15 PM   Specimen: Nasopharyngeal Swab;  Nasopharyngeal(NP) swabs in vial transport medium  Result Value Ref Range Status   SARS Coronavirus 2 by RT PCR POSITIVE (A) NEGATIVE Final    Comment: GOODWIN R. 01.10.21 @ 2307 BY MECIAL J. (NOTE) SARS-CoV-2 target nucleic acids are DETECTED.  The SARS-CoV-2 RNA is generally detectable in upper respiratory specimens during the acute phase  of infection. Positive results are indicative of the presence of the identified virus, but do not rule out bacterial infection or co-infection with other pathogens not detected by the test. Clinical correlation with patient history and other diagnostic information is necessary to determine patient infection status. The expected result is Negative.  Fact Sheet for Patients: EntrepreneurPulse.com.au  Fact Sheet for Healthcare Providers: IncredibleEmployment.be  This test is not yet approved or cleared by the [place] FDA and  has been authorized for detection and/or diagnosis of SARS-CoV-2 by FDA under an Emergency Use Authorization (EUA).  This EUA will remain in effect (meaning this test can be used) for the duration of  the COVID-19 dec laration under Section 564(b)(1) of the Act, 21 U.S.C. section 360bbb-3(b)(1), unless the authorization is terminated or revoked sooner.     Influenza A by PCR NEGATIVE NEGATIVE Final   Influenza B by PCR NEGATIVE NEGATIVE Final    Comment: (NOTE) The Xpert Xpress SARS-CoV-2/FLU/RSV plus assay is intended as an aid in the diagnosis of influenza from Nasopharyngeal swab specimens and should not be used as a sole basis for treatment. Nasal washings and aspirates are unacceptable for Xpert Xpress SARS-CoV-2/FLU/RSV testing.  Fact Sheet for Patients: EntrepreneurPulse.com.au  Fact Sheet for Healthcare Providers: IncredibleEmployment.be  This test is not yet approved or cleared by the [place] FDA and has been authorized for  detection and/or diagnosis of SARS-CoV-2 by FDA under an Emergency Use Authorization (EUA). This EUA will remain in effect (meaning this test can be used) for the duration of the COVID-19 declaration under Section 564(b)(1) of the Act, 21 U.S.C. section 360bbb-3(b)(1), unless the authorization is terminated or revoked.  Performed at [facility], [place] [address], [place], [place] [address]   Blood Culture (routine x 2)     Status: None (Preliminary result)   Collection Time: 09/24/20  8:40 PM   Specimen: BLOOD  Result Value Ref Range Status   Specimen Description   Final    BLOOD LEFT ANTECUBITAL Performed at [place] [address], [place], [place] [address]    Special Requests   Final    BOTTLES DRAWN AEROBIC AND ANAEROBIC Blood Culture adequate volume Performed at [place] [address], [place], [place] [address]    Culture   Final    NO GROWTH 4 DAYS Performed at [facility], [place] [address] [place], [place] [address]    Report Status PENDING  Incomplete  Blood Culture (routine x 2)     Status: None (Preliminary result)   Collection Time: 09/24/20  9:10 PM   Specimen: BLOOD  Result Value Ref Range Status   Specimen Description   Final    BLOOD SITE NOT SPECIFIED Performed at [place] [address] [place], [place] [address]    Special Requests   Final    BOTTLES DRAWN AEROBIC AND ANAEROBIC Blood Culture adequate volume Performed at [place] [address], [place], [place] [address]    Culture   Final    NO GROWTH 4 DAYS Performed at [facility], [place] [address] [place], [place] [address]    Report Status PENDING  Incomplete     Medications:   . albuterol  2 puff Inhalation TID  . baricitinib  4 mg Oral Daily  . docusate sodium  100 mg Oral BID  . enoxaparin (LOVENOX) injection  40 mg Subcutaneous Q24H  . ferrous sulfate  325 mg Oral Q  breakfast  . furosemide  20 mg Intravenous BID  . losartan  50 mg Oral Daily  . polyethylene glycol  17 g Oral Daily  . predniSONE  50 mg Oral Daily   Continuous Infusions:     LOS: 5 days   Charlynne Cousins  [facility]  09/29/2020, 9:11 AM

## 2020-09-29 NOTE — Progress Notes (Signed)
SATURATION QUALIFICATIONS: (This note is used to comply with regulatory documentation for home oxygen)  Patient Saturations on Room Air at Rest = 85%  Patient Saturations on Room Air while Ambulating = 88%  Patient Saturations on 2 Liters of oxygen while Ambulating = 90%  Please briefly explain why patient needs home oxygen: Patient desats with ambulation and will need home oxygen to keep O2 sats above 90%.

## 2020-09-30 LAB — BASIC METABOLIC PANEL
Anion gap: 10 (ref 5–15)
BUN: 13 mg/dL (ref 6–20)
CO2: 27 mmol/L (ref 22–32)
Calcium: 8.6 mg/dL — ABNORMAL LOW (ref 8.9–10.3)
Chloride: 101 mmol/L (ref 98–111)
Creatinine, Ser: 0.78 mg/dL (ref 0.44–1.00)
GFR, Estimated: >60 mL/min (ref >60)
Glucose, Bld: 128 mg/dL — ABNORMAL HIGH (ref 70–99)
Potassium: 3.8 mmol/L (ref 3.5–5.1)
Sodium: 138 mmol/L (ref 135–145)

## 2020-09-30 MED ORDER — POLYETHYLENE GLYCOL 3350 17 G PO PACK
17.0000 g | PACK | Freq: Three times a day (TID) | ORAL | Status: AC
  Administered 2020-09-30 – 2020-10-01 (×3): 17 g via ORAL
  Filled 2020-09-30 (×3): qty 1
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
## 2020-10-01 LAB — BASIC METABOLIC PANEL
Anion gap: 10 (ref 5–15)
BUN: 14 mg/dL (ref 6–20)
CO2: 25 mmol/L (ref 22–32)
Calcium: 8.7 mg/dL — ABNORMAL LOW (ref 8.9–10.3)
Chloride: 103 mmol/L (ref 98–111)
Creatinine, Ser: 0.78 mg/dL (ref 0.44–1.00)
GFR, Estimated: >60 mL/min (ref >60)
Glucose, Bld: 143 mg/dL — ABNORMAL HIGH (ref 70–99)
Potassium: 3.9 mmol/L (ref 3.5–5.1)
Sodium: 138 mmol/L (ref 135–145)
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
## 2020-10-01 NOTE — Plan of Care (Signed)

## 2020-10-01 NOTE — Discharge Summary (Signed)
Physician Discharge Summary  Barbara Randolph QMV:[id number] DOB: [birth year] DOA: 09/24/2020  PCP: Donald Prose, MD  Admit date: 09/24/2020 Discharge date: 10/01/2020  Admitted From: Home Disposition:  Home  Recommendations for Outpatient Follow-up:  1. Follow up with PCP in 1-2 weeks 2. Please obtain BMP/CBC in one week   Home Health:no Equipment/Devices:None  Discharge Condition:Stable CODE STATUS:Full Diet recommendation: Heart Healthy  Brief/Interim Summary: 50 y.o. female past medical history of essential hypertension comes in for shortness of breath he has not been vaccinated SARS-CoV-2 was positive CTA was negative for pulmonary embolism but showed bilateral pulmonary infiltrates he had to be placed on 4 oxygen liters of oxygen  Discharge Diagnoses:  Principal Problem:   Pneumonia due to COVID-19 virus Active Problems:   Hypoxia   Hypochromic microcytic anemia   Essential hypertension Acute respiratory failure with hypoxia secondarily to pneumonia due to COVID-19: She was started on IV remdesivir and steroids and oral baricitinib. She completed course of IV remdesivir and steroids in house, 24 hours prior to discharge she was weaned to room air. She will need to stay on isolation for 21 days from the date of her positive test on 2022.  Citic anemia: A menstruating female she will go home on ferrous sulfate once a day.  Essential hypertension: No changes made to her medication.   Discharge Instructions  Discharge Instructions    Diet - low sodium heart healthy   Complete by: As directed    Increase activity slowly   Complete by: As directed      Allergies as of 10/01/2020   No Known Allergies     Medication List    STOP taking these medications   docusate sodium 50 MG capsule Commonly known as: COLACE   HYDROcodone-acetaminophen 5-325 MG tablet Commonly known as: NORCO/VICODIN   ibuprofen 600 MG tablet Commonly known as: ADVIL   ondansetron 4 MG  tablet Commonly known as: ZOFRAN   simethicone 80 MG chewable tablet Commonly known as: MYLICON     TAKE these medications   acetaminophen 500 MG tablet Commonly known as: TYLENOL Take 1,000 mg by mouth every 6 (six) hours as needed.   losartan 50 MG tablet Commonly known as: COZAAR Take 50 mg by mouth daily.   multivitamin with minerals Tabs tablet Take 1 tablet by mouth daily.       No Known Allergies  Consultations:  None   Procedures/Studies: CT ANGIO CHEST PE W OR WO CONTRAST  Result Date: 09/25/2020 CLINICAL DATA:  Hypoxia, elevated D-dimer, shortness of breath. COVID. EXAM: CT ANGIOGRAPHY CHEST WITH CONTRAST TECHNIQUE: Multidetector CT imaging of the chest was performed using the standard protocol during bolus administration of intravenous contrast. Multiplanar CT image reconstructions and MIPs were obtained to evaluate the vascular anatomy. CONTRAST:  181mL OMNIPAQUE IOHEXOL 350 MG/ML SOLN COMPARISON:  Chest x-ray 09/24/2020 FINDINGS: Cardiovascular: No filling defects in the pulmonary arteries to suggest pulmonary emboli. Heart is normal size. Aorta is normal caliber. Mediastinum/Nodes: No mediastinal, hilar, or axillary adenopathy. Trachea and esophagus are unremarkable. Thyroid unremarkable. Lungs/Pleura: Extensive bilateral ground-glass airspace opacities compatible with COVID pneumonia. No pneumothorax or effusions. Upper Abdomen: Imaging into the upper abdomen demonstrates no acute findings. Musculoskeletal: Chest wall soft tissues are unremarkable. No acute bony abnormality. Review of the MIP images confirms the above findings. IMPRESSION: No evidence of pulmonary embolus. Extensive bilateral ground-glass airspace opacities compatible with COVID pneumonia. Electronically Signed   By: Rolm Baptise M.D.   On: 09/25/2020 03:34   DG Chest  Port 1 View  Result Date: 09/24/2020 CLINICAL DATA:  COVID symptoms. Possible COVID exposure. Shortness of breath. EXAM: PORTABLE  CHEST 1 VIEW COMPARISON:  None. FINDINGS: Lung volumes are low. Upper normal heart size is likely related to low lung volumes. Rather extensive heterogeneous bilateral lung opacities, right greater than left. Trace fluid in the right minor fissure. No pneumothorax or large pleural effusion. No acute osseous abnormalities are seen. IMPRESSION: 1. Rather extensive bilateral heterogeneous lung opacities, right greater than left, consistent with COVID pneumonia. 2. Trace fluid in the right minor fissure. 3. Low lung volumes. Electronically Signed   By: Keith Rake M.D.   On: 09/24/2020 20:04    (Echo, Carotid, EGD, Colonoscopy, ERCP)    Subjective: No new complaints  Discharge Exam: Vitals:   09/30/20 2035 10/01/20 0535  BP: 110/68 112/65  Pulse: (!) 59 (!) 58  Resp: 20 17  Temp: 98.7 F (37.1 C) 98.4 F (36.9 C)  SpO2: 99% 95%   Vitals:   09/30/20 0526 09/30/20 1401 09/30/20 2035 10/01/20 0535  BP: 117/61 (!) 99/56 110/68 112/65  Pulse: 62 80 (!) 59 (!) 58  Resp: 18 16 20 17   Temp: 98.1 F (36.7 C) 98.7 F (37.1 C) 98.7 F (37.1 C) 98.4 F (36.9 C)  TempSrc: Oral Oral  Oral  SpO2: 92% 96% 99% 95%  Weight:      Height:        General: Pt is alert, awake, not in acute distress Cardiovascular: RRR, S1/S2 +, no rubs, no gallops Respiratory: CTA bilaterally, no wheezing, no rhonchi Abdominal: Soft, NT, ND, bowel sounds + Extremities: no edema, no cyanosis    The results of significant diagnostics from this hospitalization (including imaging, microbiology, ancillary and laboratory) are listed below for reference.     Microbiology: Recent Results (from the past 240 hour(s))  Resp Panel by RT-PCR (Flu A&B, Covid) Nasopharyngeal Swab     Status: Abnormal   Collection Time: 09/24/20  8:15 PM   Specimen: Nasopharyngeal Swab; Nasopharyngeal(NP) swabs in vial transport medium  Result Value Ref Range Status   SARS Coronavirus 2 by RT PCR POSITIVE (A) NEGATIVE Final     Comment: GOODWIN R. 01.10.21 @ 2307 BY MECIAL J. (NOTE) SARS-CoV-2 target nucleic acids are DETECTED.  The SARS-CoV-2 RNA is generally detectable in upper respiratory specimens during the acute phase of infection. Positive results are indicative of the presence of the identified virus, but do not rule out bacterial infection or co-infection with other pathogens not detected by the test. Clinical correlation with patient history and other diagnostic information is necessary to determine patient infection status. The expected result is Negative.  Fact Sheet for Patients: EntrepreneurPulse.com.au  Fact Sheet for Healthcare Providers: IncredibleEmployment.be  This test is not yet approved or cleared by the [place] FDA and  has been authorized for detection and/or diagnosis of SARS-CoV-2 by FDA under an Emergency Use Authorization (EUA).  This EUA will remain in effect (meaning this test can be used) for the duration of  the COVID-19 dec laration under Section 564(b)(1) of the Act, 21 U.S.C. section 360bbb-3(b)(1), unless the authorization is terminated or revoked sooner.     Influenza A by PCR NEGATIVE NEGATIVE Final   Influenza B by PCR NEGATIVE NEGATIVE Final    Comment: (NOTE) The Xpert Xpress SARS-CoV-2/FLU/RSV plus assay is intended as an aid in the diagnosis of influenza from Nasopharyngeal swab specimens and should not be used as a sole basis for treatment. Nasal washings and  aspirates are unacceptable for Xpert Xpress SARS-CoV-2/FLU/RSV testing.  Fact Sheet for Patients: EntrepreneurPulse.com.au  Fact Sheet for Healthcare Providers: IncredibleEmployment.be  This test is not yet approved or cleared by the [place] FDA and has been authorized for detection and/or diagnosis of SARS-CoV-2 by FDA under an Emergency Use Authorization (EUA). This EUA will remain in effect (meaning this test can  be used) for the duration of the COVID-19 declaration under Section 564(b)(1) of the Act, 21 U.S.C. section 360bbb-3(b)(1), unless the authorization is terminated or revoked.  Performed at [facility], [place] [address] [place], [place] [address]   Blood Culture (routine x 2)     Status: None   Collection Time: 09/24/20  8:40 PM   Specimen: BLOOD  Result Value Ref Range Status   Specimen Description   Final    BLOOD LEFT ANTECUBITAL Performed at [facility] [address] [place], [place] [address]    Special Requests   Final    BOTTLES DRAWN AEROBIC AND ANAEROBIC Blood Culture adequate volume Performed at [facility] [address] [place], [place] [address]    Culture   Final    NO GROWTH 5 DAYS Performed at [facility], [place] [address], [place], [place] [address]    Report Status 09/29/2020 FINAL  Final  Blood Culture (routine x 2)     Status: None   Collection Time: 09/24/20  9:10 PM   Specimen: BLOOD  Result Value Ref Range Status   Specimen Description   Final    BLOOD SITE NOT SPECIFIED Performed at [facility] [address], [place], [place] [address]    Special Requests   Final    BOTTLES DRAWN AEROBIC AND ANAEROBIC Blood Culture adequate volume Performed at [facility] [address], [place], [place] [address]    Culture   Final    NO GROWTH 5 DAYS Performed at [facility], [place] [address], [place], [place] [address]    Report Status 09/29/2020 FINAL  Final     Labs: BNP (last 3 results) No results for input(s): BNP in the last 8760 hours. Basic Metabolic Panel: Recent Labs  Lab 09/27/20 0414 09/28/20 0355 09/29/20 0357 09/30/20 0316 10/01/20 0241  NA 142 141 141 138 138  K 3.9 3.8 3.7 3.8 3.9  CL 105 104 103 101 103  CO2 28 26 28 27 25   GLUCOSE 186* 162* 119* 128* 143*  BUN 14 14 14 13 14   CREATININE 0.85 0.78 0.76 0.78 0.78  CALCIUM 8.7*  8.7* 8.7* 8.6* 8.7*   Liver Function Tests: Recent Labs  Lab 09/25/20 0356 09/26/20 0516 09/27/20 0414 09/28/20 0355 09/29/20 0357  AST 35 36 33 28 20  ALT 18 21 22 25 23   ALKPHOS 53 55 49 50 47  BILITOT 0.6 0.6 0.6 0.6 0.6  PROT 6.9 6.9 6.4* 6.3* 6.0*  ALBUMIN 3.1* 3.2* 3.0* 3.3* 2.9*   No results for input(s): LIPASE, AMYLASE in the last 168 hours. No results for input(s): AMMONIA in the last 168 hours. CBC: Recent Labs  Lab 09/25/20 0356 09/26/20 0516 09/27/20 0414 09/28/20 0355 09/29/20 0357  WBC 3.7* 4.0 7.3 9.7 6.9  NEUTROABS 3.0 2.8 5.7 7.2 4.4  HGB 8.0* 8.0* 8.1* 8.2* 8.3*  HCT 29.8* 28.8* 28.8* 30.6* 30.4*  MCV 71.1* 70.2* 70.1* 73.7* 73.1*  PLT 274 327 321 313 283   Cardiac Enzymes: No results for input(s): CKTOTAL, CKMB,  CKMBINDEX, TROPONINI in the last 168 hours. BNP: Invalid input(s): POCBNP CBG: No results for input(s): GLUCAP in the last 168 hours. D-Dimer No results for input(s): DDIMER in the last 72 hours. Hgb A1c No results for input(s): HGBA1C in the last 72 hours. Lipid Profile No results for input(s): CHOL, HDL, LDLCALC, TRIG, CHOLHDL, LDLDIRECT in the last 72 hours. Thyroid function studies No results for input(s): TSH, T4TOTAL, T3FREE, THYROIDAB in the last 72 hours.  Invalid input(s): FREET3 Anemia work up No results for input(s): VITAMINB12, FOLATE, FERRITIN, TIBC, IRON, RETICCTPCT in the last 72 hours. Urinalysis No results found for: COLORURINE, APPEARANCEUR, [place], [place], GLUCOSEU, [place], [place], [place], PROTEINUR, UROBILINOGEN, NITRITE, LEUKOCYTESUR Sepsis Labs Invalid input(s): PROCALCITONIN,  WBC,  LACTICIDVEN Microbiology Recent Results (from the past 240 hour(s))  Resp Panel by RT-PCR (Flu A&B, Covid) Nasopharyngeal Swab     Status: Abnormal   Collection Time: 09/24/20  8:15 PM   Specimen: Nasopharyngeal Swab; Nasopharyngeal(NP) swabs in vial transport medium  Result Value Ref Range Status   SARS Coronavirus 2  by RT PCR POSITIVE (A) NEGATIVE Final    Comment: GOODWIN R. 01.10.21 @ 2307 BY MECIAL J. (NOTE) SARS-CoV-2 target nucleic acids are DETECTED.  The SARS-CoV-2 RNA is generally detectable in upper respiratory specimens during the acute phase of infection. Positive results are indicative of the presence of the identified virus, but do not rule out bacterial infection or co-infection with other pathogens not detected by the test. Clinical correlation with patient history and other diagnostic information is necessary to determine patient infection status. The expected result is Negative.  Fact Sheet for Patients: EntrepreneurPulse.com.au  Fact Sheet for Healthcare Providers: IncredibleEmployment.be  This test is not yet approved or cleared by the [place] FDA and  has been authorized for detection and/or diagnosis of SARS-CoV-2 by FDA under an Emergency Use Authorization (EUA).  This EUA will remain in effect (meaning this test can be used) for the duration of  the COVID-19 dec laration under Section 564(b)(1) of the Act, 21 U.S.C. section 360bbb-3(b)(1), unless the authorization is terminated or revoked sooner.     Influenza A by PCR NEGATIVE NEGATIVE Final   Influenza B by PCR NEGATIVE NEGATIVE Final    Comment: (NOTE) The Xpert Xpress SARS-CoV-2/FLU/RSV plus assay is intended as an aid in the diagnosis of influenza from Nasopharyngeal swab specimens and should not be used as a sole basis for treatment. Nasal washings and aspirates are unacceptable for Xpert Xpress SARS-CoV-2/FLU/RSV testing.  Fact Sheet for Patients: EntrepreneurPulse.com.au  Fact Sheet for Healthcare Providers: IncredibleEmployment.be  This test is not yet approved or cleared by the [place] FDA and has been authorized for detection and/or diagnosis of SARS-CoV-2 by FDA under an Emergency Use Authorization (EUA). This EUA will  remain in effect (meaning this test can be used) for the duration of the COVID-19 declaration under Section 564(b)(1) of the Act, 21 U.S.C. section 360bbb-3(b)(1), unless the authorization is terminated or revoked.  Performed at [facility], [place] [address] [place], [place] [address]   Blood Culture (routine x 2)     Status: None   Collection Time: 09/24/20  8:40 PM   Specimen: BLOOD  Result Value Ref Range Status   Specimen Description   Final    BLOOD LEFT ANTECUBITAL Performed at [facility] [address], [place], [place] [address]    Special Requests   Final    BOTTLES DRAWN AEROBIC AND ANAEROBIC Blood Culture adequate volume Performed  at [facility], [place] [address], [place], [place] [address]    Culture   Final    NO GROWTH 5 DAYS Performed at [facility], [place] [address], [place], [place] [address]    Report Status 09/29/2020 FINAL  Final  Blood Culture (routine x 2)     Status: None   Collection Time: 09/24/20  9:10 PM   Specimen: BLOOD  Result Value Ref Range Status   Specimen Description   Final    BLOOD SITE NOT SPECIFIED Performed at [facility] [address] [place], [place] [address]    Special Requests   Final    BOTTLES DRAWN AEROBIC AND ANAEROBIC Blood Culture adequate volume Performed at [facility] [address] [place], [place] [address]    Culture   Final    NO GROWTH 5 DAYS Performed at [facility], [place] [address] [place], [place] [address]    Report Status 09/29/2020 FINAL  Final     Time coordinating discharge: Over 40 minutes  SIGNED:   Charlynne Cousins, MD  [facility] 10/01/2020, 10:05 AM Pager   If 7PM-7AM, please contact night-coverage [email or web address] Password [email or web address]

## 2021-08-22 ENCOUNTER — Other Ambulatory Visit: Payer: Self-pay | Admitting: Nurse Practitioner

## 2021-08-22 DIAGNOSIS — Z9889 Other specified postprocedural states: Secondary | ICD-10-CM

## 2021-09-20 IMAGING — CT CT ANGIO CHEST
2 of 6 series · 18 of 36 positions shown · IV contrast (OMNIPAQUE 350)
Comparison: Chest x-ray 09/24/2020

CLINICAL DATA: Hypoxia, elevated D-dimer, shortness of breath.
COVID.

EXAM:
CT ANGIOGRAPHY CHEST WITH CONTRAST
TECHNIQUE: Multidetector CT imaging of the chest was performed using the
standard protocol during bolus administration of intravenous
contrast. Multiplanar CT image reconstructions and MIPs were
obtained to evaluate the vascular anatomy.
CONTRAST:  100mL OMNIPAQUE IOHEXOL 350 MG/ML SOLN

[Series 5: thins · axial · 0.62mm/px · z∈[-308,-98]mm · 17 of 239 slices shown]
[im 14/239  lung]
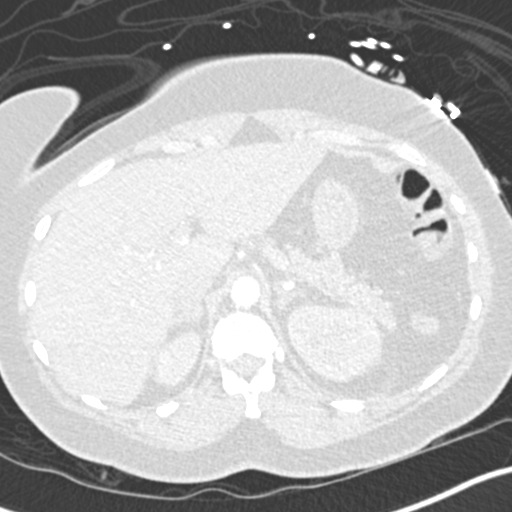
[im 27/239  mediastinal]
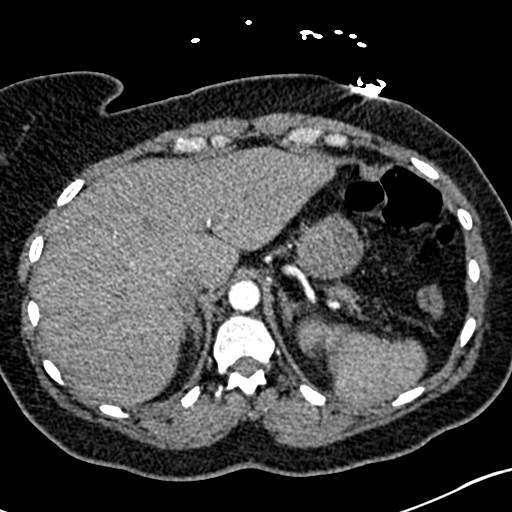
[im 40/239  lung]
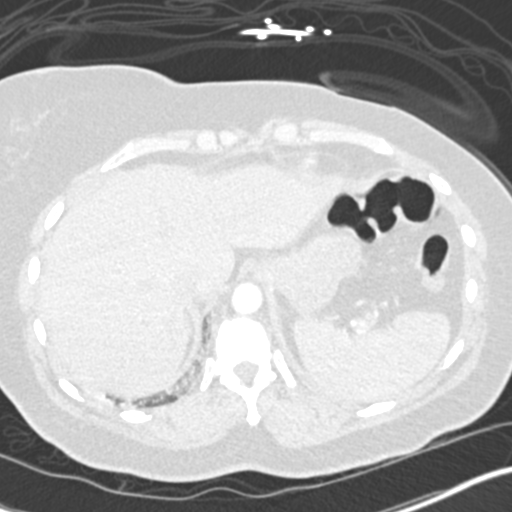
[im 53/239  mediastinal]
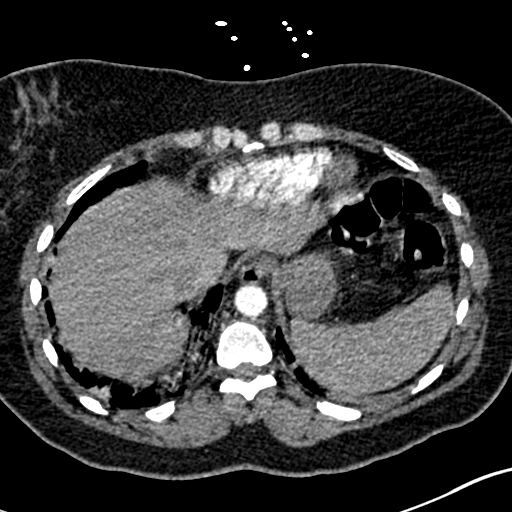
[im 67/239  lung]
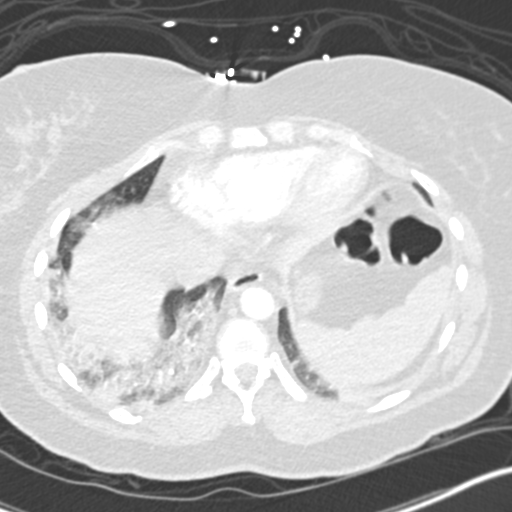
[im 80/239  mediastinal]
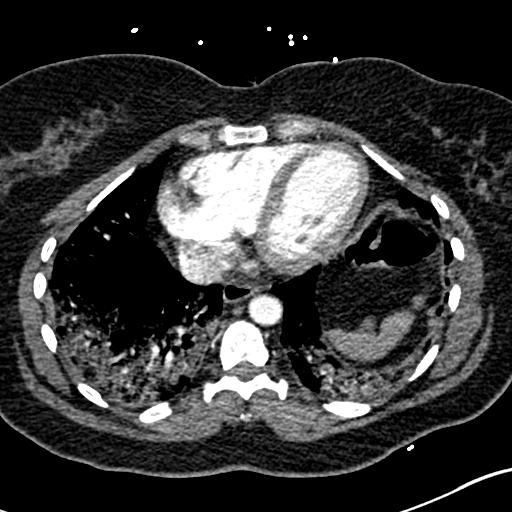
[im 93/239  lung]
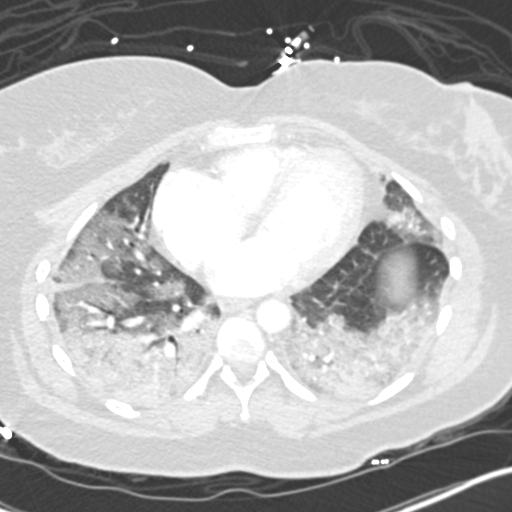
[im 106/239  mediastinal]
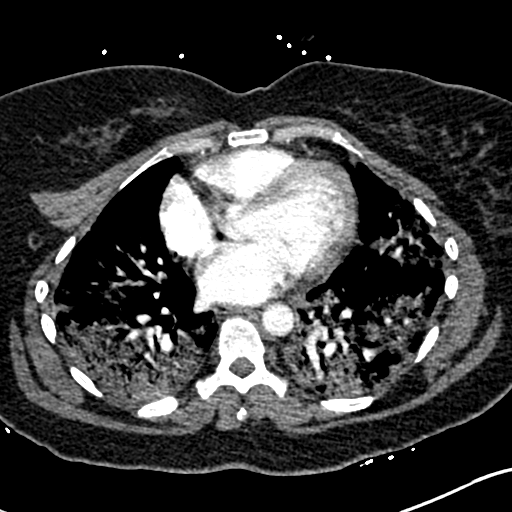
[im 120/239  lung]
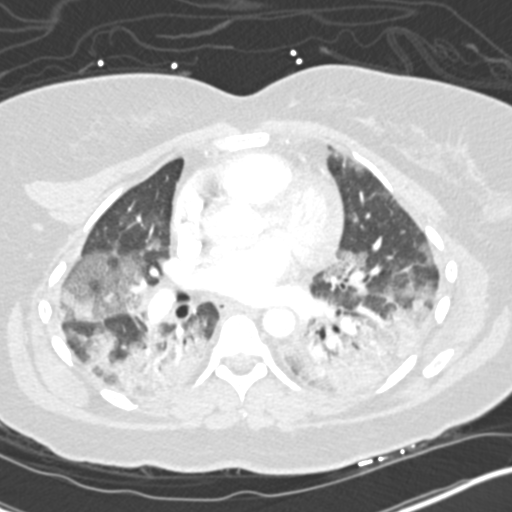
[im 133/239  mediastinal]
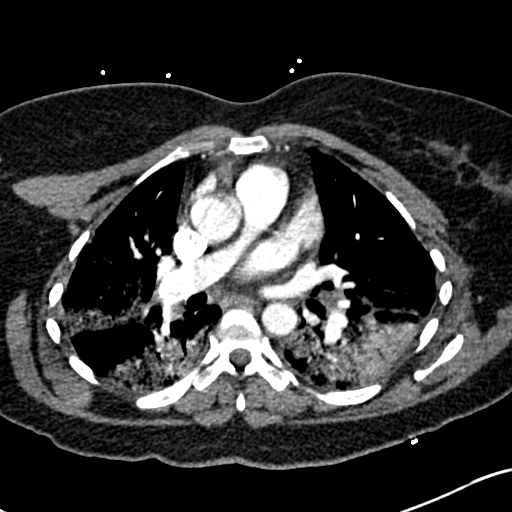
[im 146/239  lung]
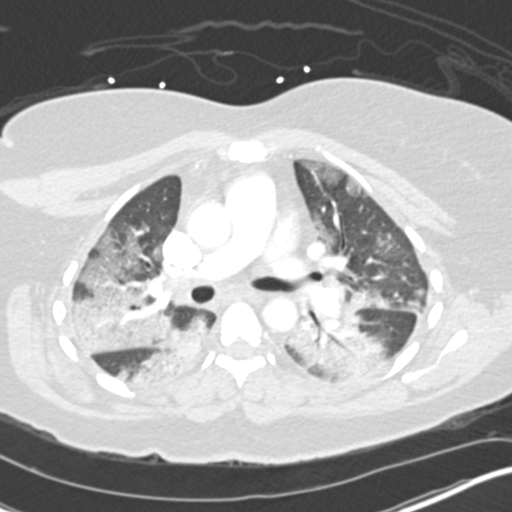
[im 159/239  mediastinal]
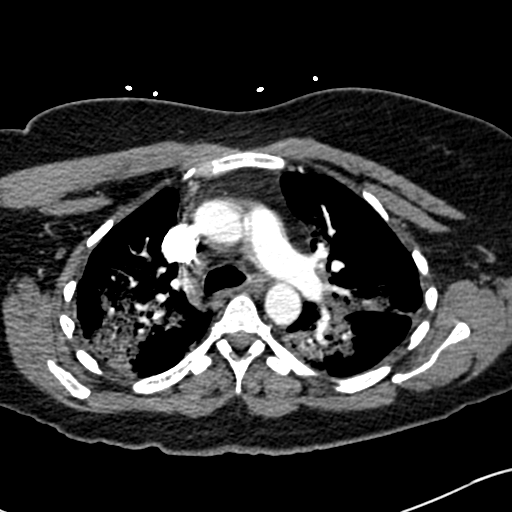
[im 172/239  lung]
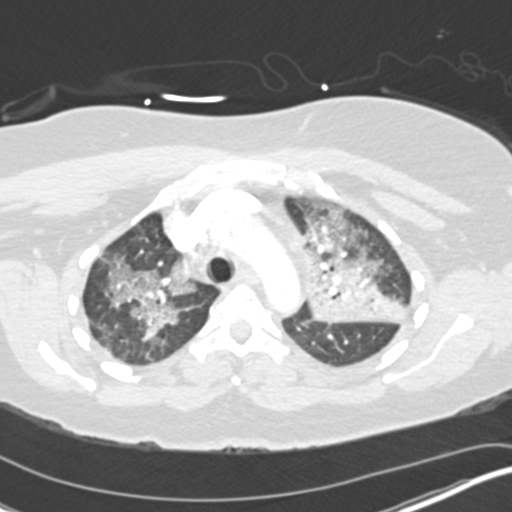
[im 186/239  mediastinal]
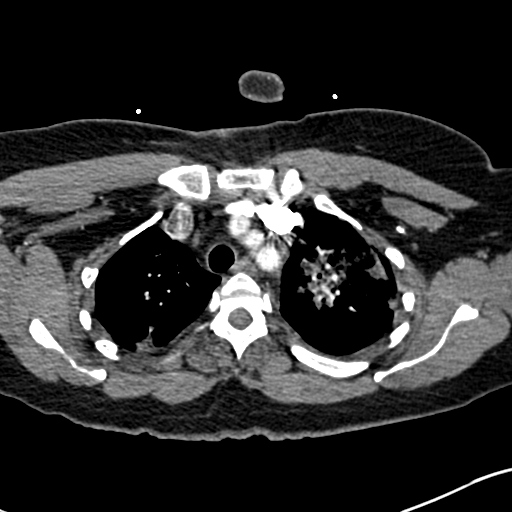
[im 199/239  lung]
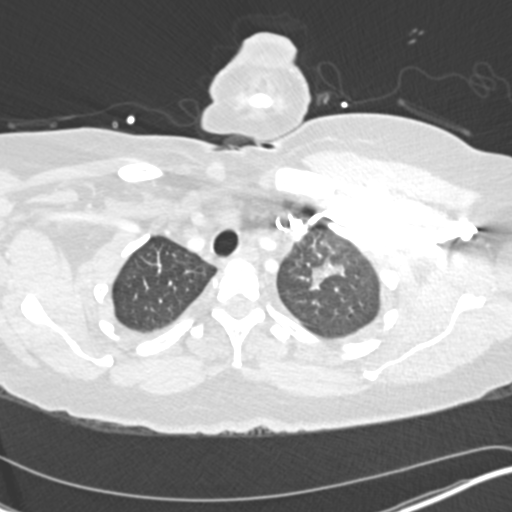
[im 212/239  mediastinal]
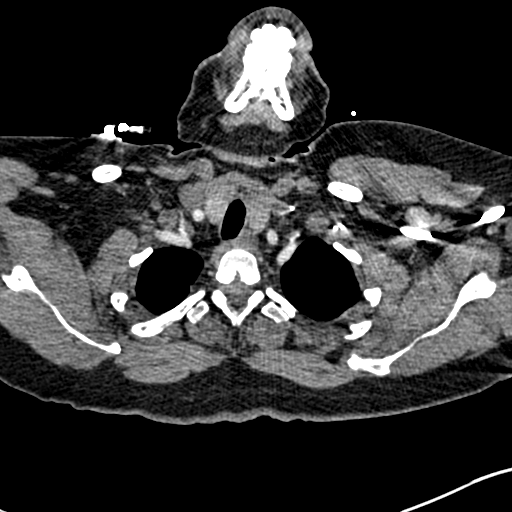
[im 225/239  lung]
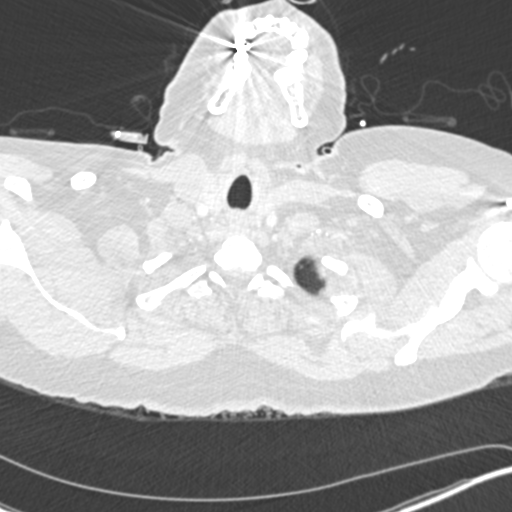

[Series 7: coronal mpr · coronal · 0.48mm/px · 1 of 122 slices shown]
[im 61/122  mediastinal]
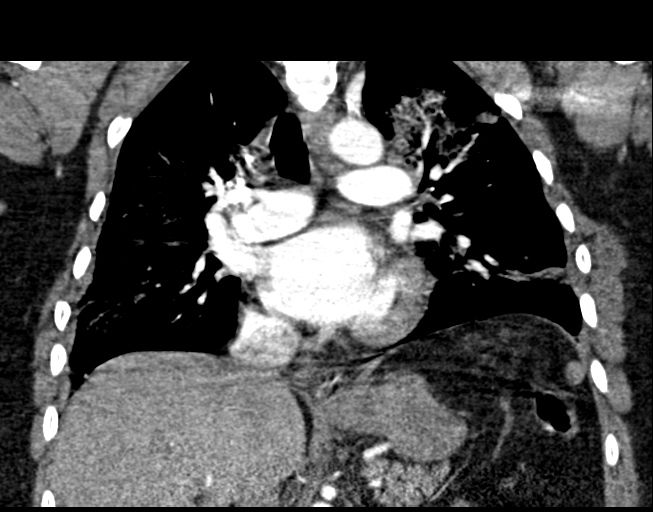

[18 of 36 positions shown; findings below may reference images not displayed]

FINDINGS: Cardiovascular: No filling defects in the pulmonary arteries to
suggest pulmonary emboli. Heart is normal size. Aorta is normal
caliber.

Mediastinum/Nodes: No mediastinal, hilar, or axillary adenopathy.
Trachea and esophagus are unremarkable. Thyroid unremarkable.

Lungs/Pleura: Extensive bilateral ground-glass airspace opacities
compatible with COVID pneumonia. No pneumothorax or effusions.

Upper Abdomen: Imaging into the upper abdomen demonstrates no acute
findings.

Musculoskeletal: Chest wall soft tissues are unremarkable. No acute
bony abnormality.

Review of the MIP images confirms the above findings.
IMPRESSION: No evidence of pulmonary embolus.

Extensive bilateral ground-glass airspace opacities compatible with
COVID pneumonia.

## 2024-05-05 ENCOUNTER — Encounter: Payer: Self-pay | Admitting: Pediatrics

## 2024-05-31 ENCOUNTER — Encounter: Payer: Self-pay | Admitting: Pediatrics

## 2024-05-31 ENCOUNTER — Ambulatory Visit (AMBULATORY_SURGERY_CENTER): Payer: Self-pay

## 2024-05-31 VITALS — Ht 62.5 in | Wt 175.0 lb

## 2024-05-31 DIAGNOSIS — Z1211 Encounter for screening for malignant neoplasm of colon: Secondary | ICD-10-CM

## 2024-05-31 MED ORDER — NA SULFATE-K SULFATE-MG SULF 17.5-3.13-1.6 GM/177ML PO SOLN: 1.0000 | Freq: Once | ORAL | 0 refills | Status: AC

## 2024-05-31 NOTE — Progress Notes (Signed)
 No egg or soy allergy known to patient  No issues known to pt with past sedation with any surgeries or procedures Patient denies ever being told they had issues or difficulty with intubation  No FH of Malignant Hyperthermia Pt is not on diet pills Pt is not on  home 02  Pt is not on blood thinners  Constipation: yes BM 2-3x a week  No A fib or A flutter Have any cardiac testing pending-- no  LOA: independent  Prep: suprep   Patient's chart reviewed by Norleen Schillings CNRA prior to previsit and patient appropriate for the [facility].  Previsit completed and red dot placed by patient's name on their procedure day (on provider's schedule).     PV completed with patient. Prep instructions sent via mychart and home address.

## 2024-06-13 ENCOUNTER — Telehealth: Payer: Self-pay | Admitting: Pediatrics

## 2024-06-13 NOTE — Telephone Encounter (Signed)
 Inbound call from patient stating that over the weekend she just received her prep instruction and she was not aware the she had to stop taking any iron and fiber supplements 5 days before her procedure. Patient is schedule for a colonoscopy on September the 30 th. Patient is requesting a call back. Please advise.

## 2024-06-13 NOTE — Telephone Encounter (Signed)
 RN returned call to patient who had called because she had not seen that she was supposed to have stopped her iron supplement 5 days prior to procedure date. She had also been eating foods that are to be avoided 5 days prior.   Patient wanted to reschedule her procedure to ensure that she had properly followed instructions and would be fully prepared for her procedure. RN rescheduled her appt.  All questions were answered. New instructions sent to patient via My Chart.

## 2024-06-14 ENCOUNTER — Encounter: Payer: Self-pay | Admitting: Pediatrics

## 2024-06-21 NOTE — Progress Notes (Unsigned)
 [facility] History and Physical   Primary Care Physician:  Sun, Vyvyan, MD   Reason for Procedure:  Colorectal cancer screening  Plan:    Colonoscopy     HPI: Barbara Randolph is a 53 y.o. female undergoing screening colonoscopy for colorectal cancer screening.  This is the patient's first colonoscopy.  There is a reported family history of colorectal cancer in the patient's maternal grandmother.  No first-degree relatives with colon cancer or colon polyps documented.  Patient denies current symptoms of change in bowel habits or rectal bleeding.   Past Medical History:  Diagnosis Date   Fibroids    Medical history non-contributory     Past Surgical History:  Procedure Laterality Date   FRACTURE SURGERY Right    hip   MYOMECTOMY N/A 02/04/2016   Procedure: MYOMECTOMY;  Surgeon: Shanda SHAUNNA Muscat, MD;  Location: [facility];  Service: Gynecology;  Laterality: N/A;    Prior to Admission medications   Medication Sig Start Date End Date Taking? Authorizing Provider  acetaminophen  (TYLENOL ) 500 MG tablet Take 1,000 mg by mouth every 6 (six) hours as needed.    [provider]  ferrous sulfate  325 (65 FE) MG tablet Take 325 mg by mouth 2 (two) times daily.    [provider]  losartan  (COZAAR ) 50 MG tablet Take 50 mg by mouth daily.    [provider]  Multiple Vitamin (MULTIVITAMIN WITH MINERALS) TABS Take 1 tablet by mouth daily.    [provider]  tretinoin microspheres (RETIN-A MICRO) 0.1 % gel Apply 1 Application topically at bedtime.    [provider]    Current Outpatient Medications  Medication Sig Dispense Refill   losartan  (COZAAR ) 50 MG tablet Take 50 mg by mouth daily.     acetaminophen  (TYLENOL ) 500 MG tablet Take 1,000 mg by mouth every 6 (six) hours as needed.     ferrous sulfate  325 (65 FE) MG tablet Take 325 mg by mouth 2 (two) times daily.     Multiple Vitamin (MULTIVITAMIN WITH MINERALS) TABS Take 1 tablet by  mouth daily.     tretinoin microspheres (RETIN-A MICRO) 0.1 % gel Apply 1 Application topically at bedtime.     Current Facility-Administered Medications  Medication Dose Route Frequency Provider Last Rate Last Admin   0.9 %  sodium chloride  infusion  500 mL Intravenous Once Yajayra Feldt, Inocente HERO, MD        Allergies as of 06/22/2024   (No Known Allergies)    Family History  Problem Relation Age of Onset   Breast cancer Maternal Aunt    Colon cancer Maternal Grandmother    Rectal cancer Neg Hx    Stomach cancer Neg Hx    Esophageal cancer Neg Hx     Social History   Socioeconomic History   Marital status: Single    Spouse name: Not on file   Number of children: Not on file   Years of education: Not on file   Highest education level: Not on file  Occupational History   Not on file  Tobacco Use   Smoking status: Never   Smokeless tobacco: Never  Substance and Sexual Activity   Alcohol use: No    Alcohol/week: 0.0 standard drinks of alcohol   Drug use: No   Sexual activity: Not on file  Other Topics Concern   Not on file  Social History Narrative   Not on file   Social Drivers of Health   Financial Resource Strain: Low Risk  (  05/03/2024)   Received from [facility]   Overall Financial Resource Strain (CARDIA)    How hard is it for you to pay for the very basics like food, housing, medical care, and heating?: Not very hard  Food Insecurity: No Food Insecurity (05/03/2024)   Received from [facility]   Hunger Vital Sign    Within the past 12 months, you worried that your food would run out before you got the money to buy more.: Never true    Within the past 12 months, the food you bought just didn't last and you didn't have money to get more.: Never true  Transportation Needs: No Transportation Needs (05/03/2024)   Received from [facility] - Transportation    In the past 12 months, has lack of transportation kept you from medical appointments or from  getting medications?: No    In the past 12 months, has lack of transportation kept you from meetings, work, or from getting things needed for daily living?: No  Physical Activity: Insufficiently Active (05/03/2024)   Received from [facility]   Exercise Vital Sign    On average, how many days per week do you engage in moderate to strenuous exercise (like a brisk walk)?: 2 days    On average, how many minutes do you engage in exercise at this level?: 40 min  Stress: No Stress Concern Present (05/03/2024)   Received from [facility] - Occupational Stress Questionnaire    Do you feel stress - tense, restless, nervous, or anxious, or unable to sleep at night because your mind is troubled all the time - these days?: Not at all  Social Connections: Patient Declined (05/03/2024)   Received from [facility]   Social Network    How would you rate your social network (family, work, friends)?: Patient declined  Intimate Partner Violence: Not At Risk (05/03/2024)   Received from [facility]   HITS    Over the last 12 months how often did your partner physically hurt you?: Never    Over the last 12 months how often did your partner insult you or talk down to you?: Never    Over the last 12 months how often did your partner threaten you with physical harm?: Never    Over the last 12 months how often did your partner scream or curse at you?: Never    Review of Systems:  All other review of systems negative except as mentioned in the HPI.  Physical Exam: Vital signs BP 128/85   Pulse (!) 55   Temp (!) 97.3 F (36.3 C) (Skin)   Ht 5' 2.5 (1.588 m)   Wt 175 lb (79.4 kg)   LMP  (Approximate)   SpO2 98%   BMI 31.50 kg/m   General:   Alert,  Well-developed, well-nourished, pleasant and cooperative in NAD Airway:  Mallampati 2 Lungs:  Clear throughout to auscultation.   Heart:  Regular rate and rhythm; no murmurs, clicks, rubs,  or  gallops. Abdomen:  Soft, nontender and nondistended. Normal bowel sounds.   Neuro/Psych:  Normal mood and affect. A and O x 3  Inocente Hausen, MD [facility]

## 2024-06-22 ENCOUNTER — Ambulatory Visit: Admitting: Pediatrics

## 2024-06-22 ENCOUNTER — Encounter: Payer: Self-pay | Admitting: Pediatrics

## 2024-06-22 VITALS — BP 111/56 | HR 48 | Temp 97.3°F | Resp 11 | Ht 62.5 in | Wt 175.0 lb

## 2024-06-22 DIAGNOSIS — D124 Benign neoplasm of descending colon: Secondary | ICD-10-CM

## 2024-06-22 DIAGNOSIS — D128 Benign neoplasm of rectum: Secondary | ICD-10-CM | POA: Diagnosis not present

## 2024-06-22 DIAGNOSIS — D123 Benign neoplasm of transverse colon: Secondary | ICD-10-CM | POA: Diagnosis not present

## 2024-06-22 DIAGNOSIS — Z1211 Encounter for screening for malignant neoplasm of colon: Secondary | ICD-10-CM | POA: Diagnosis present

## 2024-06-22 MED ORDER — SODIUM CHLORIDE 0.9 % IV SOLN: 500.0000 mL | Freq: Once | INTRAVENOUS | Status: DC

## 2024-06-22 NOTE — Progress Notes (Signed)
Pt. states no medical or surgical changes since previsit or office visit. 

## 2024-06-22 NOTE — Op Note (Signed)
 [facility] Patient Name: Barbara Randolph Procedure Date: 06/22/2024 10:16 AM MRN: [id number] Endoscopist: Inocente Hausen , MD, [id number] Age: 53 Referring MD:  Date of Birth: 03-11-1971 Gender: Female Account #: [id number] Procedure:                Colonoscopy Indications:              Screening for colorectal malignant neoplasm, This                            is the patient's first colonoscopy, Family history                            of colon cancer in multiple second-degree relatives                            (maternal grandmother and maternal uncle) Medicines:                Monitored Anesthesia Care Procedure:                Pre-Anesthesia Assessment:                           - Prior to the procedure, a History and Physical                            was performed, and patient medications and                            allergies were reviewed. The patient's tolerance of                            previous anesthesia was also reviewed. The risks                            and benefits of the procedure and the sedation                            options and risks were discussed with the patient.                            All questions were answered, and informed consent                            was obtained. Prior Anticoagulants: The patient has                            taken no anticoagulant or antiplatelet agents. ASA                            Grade Assessment: II - A patient with mild systemic                            disease. After reviewing the risks and benefits,  the patient was deemed in satisfactory condition to                            undergo the procedure.                           After obtaining informed consent, the colonoscope                            was passed under direct vision. Throughout the                            procedure, the patient's blood pressure, pulse, and                            oxygen  saturations were monitored continuously. The                            CF [id number] #[id number] was introduced through the anus                            and advanced to the cecum, identified by                            appendiceal orifice and ileocecal valve. The                            colonoscopy was performed without difficulty. The                            patient tolerated the procedure well. The quality                            of the bowel preparation was good. The ileocecal                            valve, appendiceal orifice, and rectum were                            photographed. Scope In: 10:31:25 AM Scope Out: 10:47:13 AM Scope Withdrawal Time: 0 hours 12 minutes 59 seconds  Total Procedure Duration: 0 hours 15 minutes 48 seconds  Findings:                 The perianal and digital rectal examinations were                            normal. Pertinent negatives include normal                            sphincter tone and no palpable rectal lesions.                           Two sessile polyps were found in the descending  colon and transverse colon. The polyps were 4 to 5                            mm in size. These polyps were removed with a cold                            snare. Resection and retrieval were complete.                           A 3 mm polyp was found in the rectum. The polyp was                            sessile. The polyp was removed with a cold biopsy                            forceps. Resection and retrieval were complete.                           The retroflexed view of the distal rectum and anal                            verge was normal and showed no anal or rectal                            abnormalities. Complications:            No immediate complications. Estimated blood loss:                            Minimal. Estimated Blood Loss:     Estimated blood loss was minimal. Impression:               - Two 4 to 5 mm  polyps in the descending colon and                            in the transverse colon, removed with a cold snare.                            Resected and retrieved.                           - One 3 mm polyp in the rectum, removed with a cold                            biopsy forceps. Resected and retrieved.                           - The distal rectum and anal verge are normal on                            retroflexion view. Recommendation:           - Discharge patient to home (ambulatory).                           -  Await pathology results.                           - Repeat colonoscopy for surveillance based on                            pathology results.                           - The findings and recommendations were discussed                            with the patient's family.                           - Patient has a contact number available for                            emergencies. The signs and symptoms of potential                            delayed complications were discussed with the                            patient. Return to normal activities tomorrow.                            Written discharge instructions were provided to the                            patient. Inocente Hausen, MD 06/22/2024 10:52:15 AM This report has been signed electronically.

## 2024-06-22 NOTE — Patient Instructions (Signed)
Please read handouts provided Await pathology results.   YOU HAD AN ENDOSCOPIC PROCEDURE TODAY AT [facility]:   Refer to the procedure report that was given to you for any specific questions about what was found during the examination.  If the procedure report does not answer your questions, please call your gastroenterologist to clarify.  If you requested that your care partner not be given the details of your procedure findings, then the procedure report has been included in a sealed envelope for you to review at your convenience later.  YOU SHOULD EXPECT: Some feelings of bloating in the abdomen. Passage of more gas than usual.  Walking can help get rid of the air that was put into your GI tract during the procedure and reduce the bloating. If you had a lower endoscopy (such as a colonoscopy or flexible sigmoidoscopy) you may notice spotting of blood in your stool or on the toilet paper. If you underwent a bowel prep for your procedure, you may not have a normal bowel movement for a few days.  Please Note:  You might notice some irritation and congestion in your nose or some drainage.  This is from the oxygen used during your procedure.  There is no need for concern and it should clear up in a day or so.  SYMPTOMS TO REPORT IMMEDIATELY:  Following lower endoscopy (colonoscopy or flexible sigmoidoscopy):  Excessive amounts of blood in the stool  Significant tenderness or worsening of abdominal pains  Swelling of the abdomen that is new, acute  Fever of 100F or higher  For urgent or emergent issues, a gastroenterologist can be reached at any hour by calling [phone]. Do not use MyChart messaging for urgent concerns.    DIET:  We do recommend a small meal at first, but then you may proceed to your regular diet.  Drink plenty of fluids but you should avoid alcoholic beverages for 24 hours.  ACTIVITY:  You should plan to take it easy for the rest of today and you should  NOT DRIVE or use heavy machinery until tomorrow (because of the sedation medicines used during the test).    FOLLOW UP: Our staff will call the number listed on your records the next business day following your procedure.  We will call around 7:15- 8:00 am to check on you and address any questions or concerns that you may have regarding the information given to you following your procedure. If we do not reach you, we will leave a message.     If any biopsies were taken you will be contacted by phone or by letter within the next 1-3 weeks.  Please call us at [phone] if you have not heard about the biopsies in 3 weeks.    SIGNATURES/CONFIDENTIALITY: You and/or your care partner have signed paperwork which will be entered into your electronic medical record.  These signatures attest to the fact that that the information above on your After Visit Summary has been reviewed and is understood.  Full responsibility of the confidentiality of this discharge information lies with you and/or your care-partner.

## 2024-06-22 NOTE — Progress Notes (Signed)
 Sedate, gd SR, tolerated procedure well, VSS, report to RN

## 2024-06-22 NOTE — Progress Notes (Signed)
 Called to room to assist during endoscopic procedure.  Patient ID and intended procedure confirmed with present staff. Received instructions for my participation in the procedure from the performing physician.

## 2024-06-23 ENCOUNTER — Telehealth: Payer: Self-pay

## 2024-06-23 NOTE — Telephone Encounter (Signed)
  Follow up Call-     06/22/2024    8:52 AM  Call back number  Post procedure Call Back phone  # [phone]  Permission to leave phone message Yes     Patient questions:  Do you have a fever, pain , or abdominal swelling? No. Pain Score  0 *  Have you tolerated food without any problems? Yes.    Have you been able to return to your normal activities? Yes.    Do you have any questions about your discharge instructions: Diet   No. Medications  No. Follow up visit  No.  Do you have questions or concerns about your Care? No.  Actions: * If pain score is 4 or above: No action needed, pain <4.

## 2024-06-24 LAB — SURGICAL PATHOLOGY

## 2024-06-27 ENCOUNTER — Ambulatory Visit: Payer: Self-pay | Admitting: Pediatrics
